# Patient Record
Sex: Male | Born: 1955 | Race: White | Hispanic: No | Marital: Married | Smoking: Never smoker
Health system: Southern US, Community
[De-identification: ages and names within clinical notes are randomized; demographics above are authoritative.]

## PROBLEM LIST (undated history)

## (undated) DIAGNOSIS — M1990 Unspecified osteoarthritis, unspecified site: Secondary | ICD-10-CM

## (undated) DIAGNOSIS — I10 Essential (primary) hypertension: Secondary | ICD-10-CM

## (undated) DIAGNOSIS — R7303 Prediabetes: Secondary | ICD-10-CM

## (undated) DIAGNOSIS — I447 Left bundle-branch block, unspecified: Secondary | ICD-10-CM

## (undated) DIAGNOSIS — E079 Disorder of thyroid, unspecified: Secondary | ICD-10-CM

## (undated) DIAGNOSIS — E039 Hypothyroidism, unspecified: Secondary | ICD-10-CM

## (undated) DIAGNOSIS — E559 Vitamin D deficiency, unspecified: Secondary | ICD-10-CM

## (undated) DIAGNOSIS — I8390 Asymptomatic varicose veins of unspecified lower extremity: Secondary | ICD-10-CM

## (undated) DIAGNOSIS — E785 Hyperlipidemia, unspecified: Secondary | ICD-10-CM

## (undated) HISTORY — DX: Asymptomatic varicose veins of unspecified lower extremity: I83.90

## (undated) HISTORY — DX: Left bundle-branch block, unspecified: I44.7

## (undated) HISTORY — DX: Essential (primary) hypertension: I10

## (undated) HISTORY — DX: Disorder of thyroid, unspecified: E07.9

## (undated) HISTORY — DX: Prediabetes: R73.03

## (undated) HISTORY — DX: Vitamin D deficiency, unspecified: E55.9

## (undated) HISTORY — PX: WISDOM TOOTH EXTRACTION: SHX21

## (undated) HISTORY — DX: Hyperlipidemia, unspecified: E78.5

---

## 1985-10-29 HISTORY — PX: OTHER SURGICAL HISTORY: SHX169

## 1998-05-29 ENCOUNTER — Emergency Department (HOSPITAL_COMMUNITY): Admission: EM | Admit: 1998-05-29 | Discharge: 1998-05-29 | Payer: Self-pay | Admitting: Emergency Medicine

## 1999-10-30 HISTORY — PX: HERNIA REPAIR: SHX51

## 2000-04-04 ENCOUNTER — Ambulatory Visit (HOSPITAL_BASED_OUTPATIENT_CLINIC_OR_DEPARTMENT_OTHER): Admission: RE | Admit: 2000-04-04 | Discharge: 2000-04-04 | Payer: Self-pay | Admitting: General Surgery

## 2004-09-16 LAB — HM COLONOSCOPY

## 2008-10-29 HISTORY — PX: CARDIAC CATHETERIZATION: SHX172

## 2009-03-02 ENCOUNTER — Encounter: Payer: Self-pay | Admitting: Cardiovascular Disease

## 2009-03-02 LAB — CONVERTED CEMR LAB
Cholesterol: 206 mg/dL
HDL: 49 mg/dL
LDL (calc): 115 mg/dL
LDL Cholesterol: 115 mg/dL
Triglyceride fasting, serum: 210 mg/dL

## 2009-03-03 ENCOUNTER — Encounter: Payer: Self-pay | Admitting: Cardiovascular Disease

## 2009-03-30 ENCOUNTER — Ambulatory Visit: Payer: Self-pay | Admitting: Cardiovascular Disease

## 2009-03-30 DIAGNOSIS — I447 Left bundle-branch block, unspecified: Secondary | ICD-10-CM

## 2009-03-30 DIAGNOSIS — E782 Mixed hyperlipidemia: Secondary | ICD-10-CM

## 2009-03-30 DIAGNOSIS — Z91013 Allergy to seafood: Secondary | ICD-10-CM

## 2009-04-08 ENCOUNTER — Telehealth (INDEPENDENT_AMBULATORY_CARE_PROVIDER_SITE_OTHER): Payer: Self-pay | Admitting: *Deleted

## 2009-04-25 ENCOUNTER — Ambulatory Visit: Payer: Self-pay

## 2009-04-25 ENCOUNTER — Encounter: Payer: Self-pay | Admitting: Cardiovascular Disease

## 2009-05-05 ENCOUNTER — Ambulatory Visit: Payer: Self-pay | Admitting: Cardiovascular Disease

## 2009-05-05 ENCOUNTER — Encounter: Payer: Self-pay | Admitting: Cardiovascular Disease

## 2009-05-05 ENCOUNTER — Encounter (INDEPENDENT_AMBULATORY_CARE_PROVIDER_SITE_OTHER): Payer: Self-pay | Admitting: *Deleted

## 2009-05-05 LAB — CONVERTED CEMR LAB
Basophils Absolute: 0 10*3/uL (ref 0.0–0.1)
CO2: 30 meq/L (ref 19–32)
Calcium: 9.1 mg/dL (ref 8.4–10.5)
Creatinine, Ser: 1.1 mg/dL (ref 0.4–1.5)
Eosinophils Absolute: 0.1 10*3/uL (ref 0.0–0.7)
Glucose, Bld: 79 mg/dL (ref 70–99)
Lymphocytes Relative: 33.4 % (ref 12.0–46.0)
MCHC: 35.2 g/dL (ref 30.0–36.0)
MCV: 86.5 fL (ref 78.0–100.0)
Monocytes Absolute: 0.4 10*3/uL (ref 0.1–1.0)
Neutro Abs: 2.6 10*3/uL (ref 1.4–7.7)
Neutrophils Relative %: 54.6 % (ref 43.0–77.0)
RDW: 12.1 % (ref 11.5–14.6)

## 2009-05-11 ENCOUNTER — Inpatient Hospital Stay (HOSPITAL_BASED_OUTPATIENT_CLINIC_OR_DEPARTMENT_OTHER): Admission: RE | Admit: 2009-05-11 | Discharge: 2009-05-11 | Payer: Self-pay | Admitting: Cardiovascular Disease

## 2009-05-11 ENCOUNTER — Ambulatory Visit: Payer: Self-pay | Admitting: Cardiovascular Disease

## 2009-05-20 ENCOUNTER — Telehealth (INDEPENDENT_AMBULATORY_CARE_PROVIDER_SITE_OTHER): Payer: Self-pay | Admitting: *Deleted

## 2009-06-08 ENCOUNTER — Telehealth: Payer: Self-pay | Admitting: Cardiovascular Disease

## 2009-06-23 ENCOUNTER — Ambulatory Visit: Payer: Self-pay | Admitting: Cardiovascular Disease

## 2010-04-17 ENCOUNTER — Encounter (INDEPENDENT_AMBULATORY_CARE_PROVIDER_SITE_OTHER): Payer: Self-pay | Admitting: *Deleted

## 2010-11-28 NOTE — Letter (Signed)
Summary: Appointment - Reminder 2  [employer], Main Office  [address]   [place], [place] [address]   Phone: [phone]  Fax: [phone]     April 17, 2010 MRN: [id number]   [facility] [address] [place], [place]  [address]   Dear Mr. Pyle,  Our records indicate that it is time to schedule a follow-up appointment with Dr. Eden Emms in August. It is very important that we reach you to schedule this appointment. We look forward to participating in your health care needs. Please contact us at the number listed above at your earliest convenience to schedule your appointment.  If you are unable to make an appointment at this time, give [place] a call so we can update our records.     Sincerely,   [facility] Scheduling Team

## 2011-02-04 LAB — POCT I-STAT 3, ART BLOOD GAS (G3+)
TCO2: 25 mmol/L (ref 0–100)
pH, Arterial: 7.457 — ABNORMAL HIGH (ref 7.350–7.450)

## 2011-02-04 LAB — POCT I-STAT 3, VENOUS BLOOD GAS (G3P V)
TCO2: 24 mmol/L (ref 0–100)
pCO2, Ven: 38.9 mmHg — ABNORMAL LOW (ref 45.0–50.0)
pH, Ven: 7.381 — ABNORMAL HIGH (ref 7.250–7.300)

## 2011-02-28 IMAGING — CR DG CHEST 2V SAME DAY
2 series · 2 of 2 positions shown · non-contrast
Comparison: None

CLINICAL DATA: History given of shortness of breath.

CHEST - 2 VIEW SAME DAY

[view not recorded (1 of 2)]
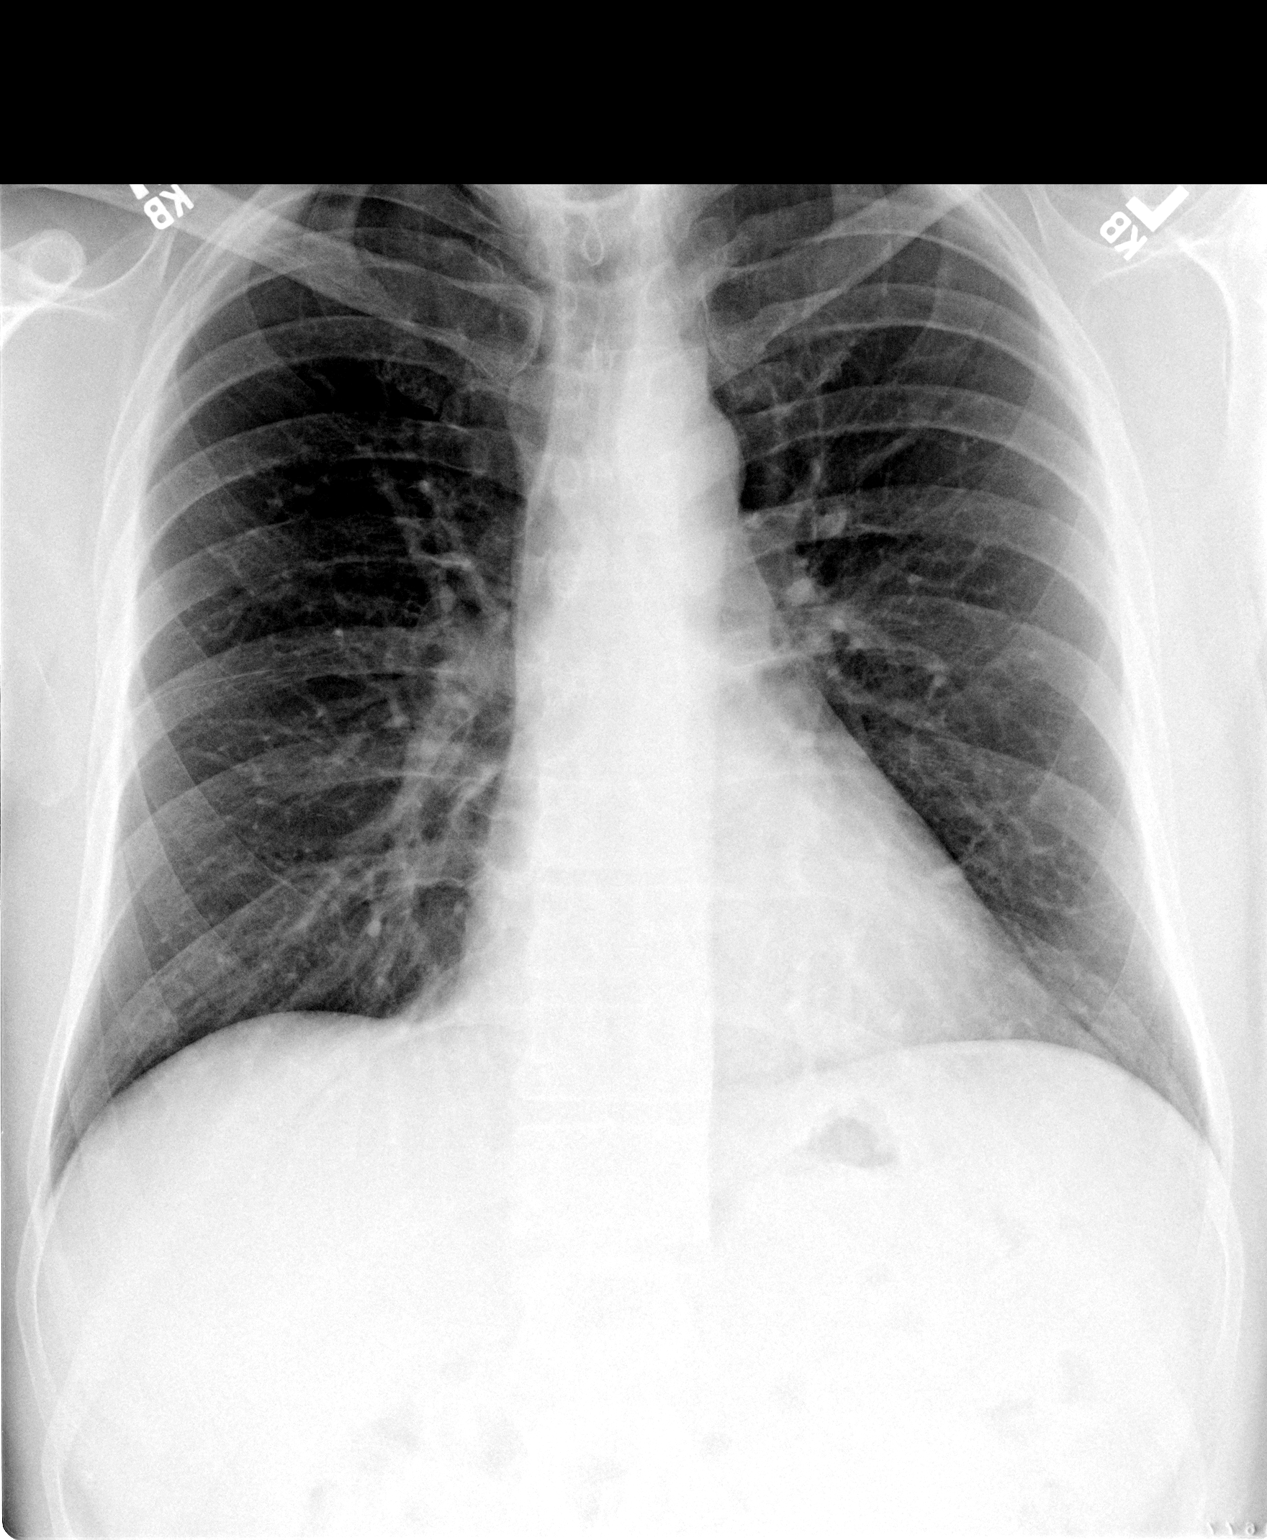

[view not recorded (2 of 2)]
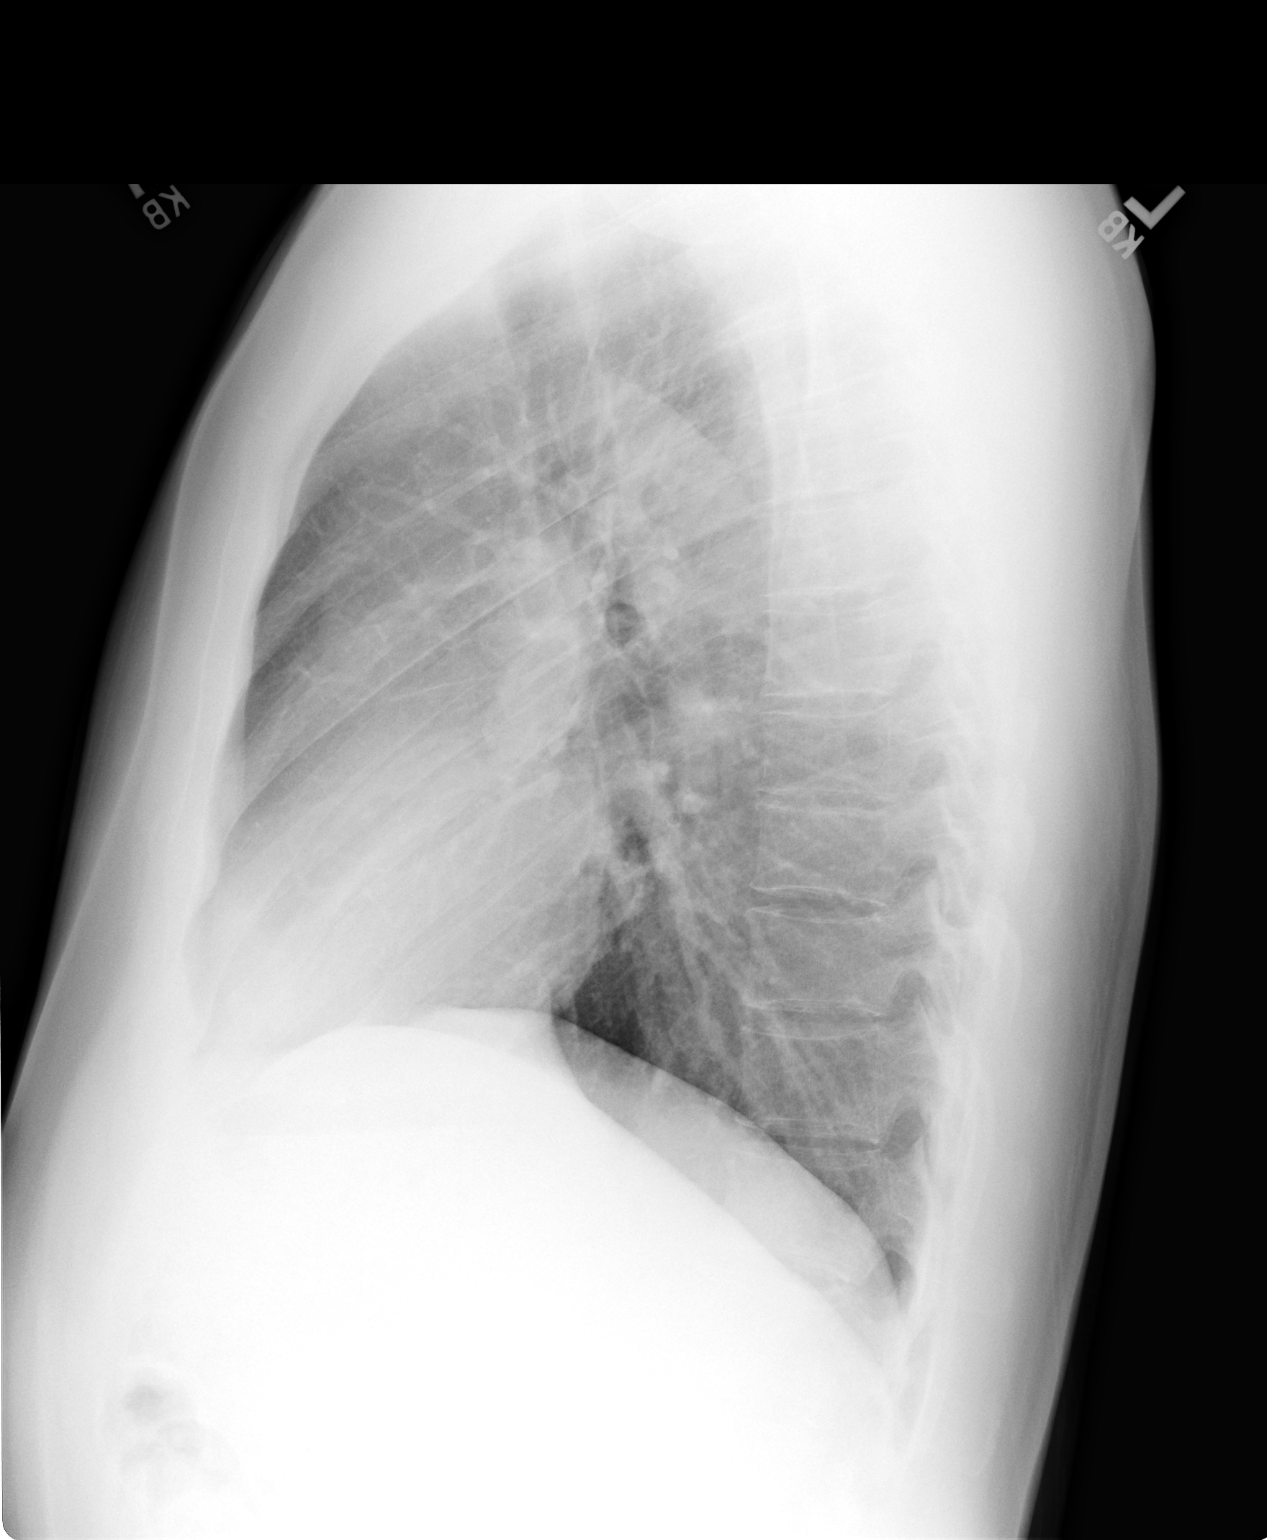

[2 of 2 positions shown; findings below may reference images not displayed]

FINDINGS: The cardiac silhouette is normal size and shape. The
lungs are well aerated and free of infiltrates. No pleural
abnormality is evident. Bones appear average for age.
IMPRESSION: No acute or active process is identified.

## 2011-03-13 NOTE — Cardiovascular Report (Signed)
[name], [name]                 ACCOUNT NO.:  [id number]   MEDICAL RECORD NO.:  [id number]          PATIENT TYPE:  OIB   LOCATION:  1961                         FACILITY:  [facility]   PHYSICIAN:  Peter C. Eden Emms, MD, FACCDATE OF BIRTH:  Nov 29, 1955   DATE OF PROCEDURE:  DATE OF DISCHARGE:                            CARDIAC CATHETERIZATION   INDICATION:  Cardiomyopathy with abnormal stress echo.  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   Left main coronary artery was normal.   Left anterior descending artery was normal.   First diagonal branch was normal.   The distal LAD was somewhat small.   The circumflex coronary artery was nondominant but large.  There was a  very large first obtuse marginal branch which was normal, mid and distal  circ were normal.   The right coronary artery was dominant.  There was a small  posterolateral branch and a large PDA.  It was normal.   Right heart catheterization showed mean right atrial pressure of 2, RV  pressure 33/10, PA pressure 17/11, mean pulmonary capillary wedge  pressure 7, LV pressure was 138/70, aortic pressure was 138/77.   RAO Ventriculography:  RAO ventriculography showed mild left ventricular  cavity enlargement with mild global hypokinesis.  The ejection fraction  was in the 50% range.   IMPRESSION:  The patient appeared to have a mild nonischemic  cardiomyopathy.  It may be related to his alcohol intake.  He is on good  medical therapy.  His filling pressures are normal.  He has no  significant coronary artery disease.  I think his overall prognosis is  good, particularly if he can stop his alcohol intake.   He will be cleared to do all activities regarding his duties with the  [employer] and is a Veterinary surgeon.   He will follow up in the office with me in 6 weeks.      [name].  Eden Emms, MD, [facility]  Electronically Signed     PCN/MEDQ  D:  05/11/2009  T:  05/11/2009  Job:  [id number]

## 2011-03-13 NOTE — Letter (Signed)
May 11, 2009    [facility]  Fax# [phone]   RE:  BREVIN, MCFADDEN  MRN:  [id number]  /  DOB:  10-17-1956   To Whom It May Concern:   Gerald [name] is a patient of [facility].  He has a history of  a left bundle-branch block.  He has mild decrease in left ventricular  function.  His ejection fraction is in the 50% range.  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   This was performed on May 11, 2009.  Fortunately, [name] has no  significant coronary artery disease.  His heart pressures were normal.  He does have mildly decreased heart function likely due to a  cardiomyopathic process, which is mild and stable.   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  He  has no limitations and his overall prognosis is excellent.   If you have any questions, do not hesitate to contact me.    Sincerely,      [name]. Eden Emms, MD, [facility]  Electronically Signed    PCN/MedQ  DD: 05/11/2009  DT: 05/12/2009  Job #: [id number]   CC:    Beatris Ship

## 2011-03-16 NOTE — Op Note (Signed)
[facility]  Patient:    Gerald [name], Gerald [name]                        MRN: [id number] Proc. Date: 04/04/00 Adm. Date:  [id number] Disc. Date: [id number] Attending:  Henrene Dodge CC:         Anselm Pancoast. Zachery Dakins, M.D.             Marinda Elk, M.D.                           Operative Report  PREOPERATIVE DIAGNOSIS:  Right inguinal hernia.  POSTOPERATIVE DIAGNOSIS:  Right inguinal hernia, indirect.  OPERATION:  Right inguinal herniorrhaphy with mesh reinforcement.  SURGEON:  Anselm Pancoast. Zachery Dakins, M.D.  ASSISTANT:  Nurse.  ANESTHESIA:  Local with sedation.  INDICATIONS:  Gerald [name] is a 55 year old Caucasian male who has had a recent onset of a bulge from the right groin and saw me recently.  He stated that he was moving a package of wet newspapers recently when he got a definite tenderness and bulge appeared shortly afterwards.  He is scheduled for an [employer] camp in approximately 10 days and desires to proceed with herniorrhaphy at this time prior to camp.  On examination, he has an obvious bulge with straining that would reduce spontaneously with relaxant.  DESCRIPTION OF PROCEDURE:  Preoperatively he was given a gram of Kefzol and IV had been started, position on the OR table.  The groin area was clipped and then prepped with Betadine solution.  He was draped in a sterile manner and the inguinal incision area was infiltrated with a mixture of Marcaine 0.25% with 0.5 Xylocaine with adrenaline in the inguinal nerve area, blocked by blunted 21 gauge needle and all total about 25 cc of solution used.  The incision was made down through the Scarpas fascia.  The superficial veins x 2 was clamped, divided and ligated with fine Vicryl and then the external oblique aponeurosis identified and opened through the external ring.  The cord structures were elevated and this was noted to be an indirect component coming down with the cord  structures and the hernia sac was separated from the cord structures and high sac ligation done under direct vision.  The vas and vessels were protected nicely.  There was also a little lipoma of the cord that was separated from the cord structures and this was clamped with a hemostat and then ligated with fine Vicryl.  The patient was also developing a direct inguinal hernia that was really not symptomatic but I reinforced the floor of the Shouldice type repair with 2-0 Prolene recreating an internal ring and tying the two ends back together.  A piece of Prolene mesh was then placed shaped like a sail, slit laterally, staring the symphysis pubis.  The inferior limbs sutured to the running 2-0 Prolene.  The superior limbs interrupted sutures to the conjoin tendon and rectus fascia area.  The two tails were then sutured together lateral and recreating the internal ring.  It was snug but not tight.  The ilioinguinal nerve had been protected and the mesh was actually under this as it goes not with the cord structures but slightly superior.  The external oblique was then closed with 3-0 Vicryl.  The Scarpas fascia interrupted 3-0 Vicryl, 4-0 Dexon subcuticular, benzoin and Steri-Strips on the skin.  The testicle was in its normal position  and he was awake and hungry at the completion of surgery. DD:  04/04/00 TD:  04/09/00 Job: [id number]

## 2012-08-08 ENCOUNTER — Emergency Department (HOSPITAL_BASED_OUTPATIENT_CLINIC_OR_DEPARTMENT_OTHER)
Admission: EM | Admit: 2012-08-08 | Discharge: 2012-08-08 | Disposition: A | Discharge status: Home / Self Care | Payer: 59 | Attending: Emergency Medicine | Admitting: Emergency Medicine

## 2012-08-08 ENCOUNTER — Encounter (HOSPITAL_BASED_OUTPATIENT_CLINIC_OR_DEPARTMENT_OTHER): Payer: Self-pay | Admitting: Emergency Medicine

## 2012-08-08 DIAGNOSIS — X58XXXA Exposure to other specified factors, initial encounter: Secondary | ICD-10-CM | POA: Insufficient documentation

## 2012-08-08 DIAGNOSIS — T7840XA Allergy, unspecified, initial encounter: Secondary | ICD-10-CM | POA: Insufficient documentation

## 2012-08-08 MED ORDER — PREDNISONE 50 MG PO TABS
60.0000 mg | ORAL_TABLET | Freq: Once | ORAL | Status: AC
  Administered 2012-08-08: 60 mg via ORAL
  Filled 2012-08-08: qty 1

## 2012-08-08 MED ORDER — PREDNISONE 50 MG PO TABS: ORAL_TABLET | ORAL | Status: DC

## 2012-08-08 MED ORDER — DIPHENHYDRAMINE HCL 25 MG PO CAPS
25.0000 mg | ORAL_CAPSULE | Freq: Once | ORAL | Status: AC
  Administered 2012-08-08: 25 mg via ORAL
  Filled 2012-08-08: qty 1

## 2012-08-08 MED ORDER — FAMOTIDINE 20 MG PO TABS: 20.0000 mg | ORAL_TABLET | Freq: Two times a day (BID) | ORAL | Status: DC

## 2012-08-08 MED ORDER — FAMOTIDINE 20 MG PO TABS
20.0000 mg | ORAL_TABLET | Freq: Once | ORAL | Status: AC
  Administered 2012-08-08: 20 mg via ORAL
  Filled 2012-08-08: qty 1

## 2012-08-08 NOTE — ED Provider Notes (Signed)
History     CSN: [id number]  Arrival date & time 08/08/12  0128   First MD Initiated Contact with Patient 08/08/12 0142      Chief Complaint  Patient presents with  . Allergic Reaction    (Consider location/radiation/quality/duration/timing/severity/associated sxs/prior treatment) Patient is a 56 y.o. male presenting with allergic reaction. The history is provided by the patient.  Allergic Reaction The primary symptoms are  rash. The primary symptoms do not include wheezing, shortness of breath, cough, abdominal pain, nausea, vomiting, diarrhea, dizziness, palpitations, altered mental status, angioedema or urticaria. The current episode started more than 2 days ago. The problem has been gradually worsening. This is a new problem.  The rash began 2 to 7 days ago. The rash appears on the chest, right arm and left arm. The pain associated with the rash is moderate. The rash is associated with itching. The rash is not associated with blisters or weeping. Risk factors: unknown.  Associated with: unknown. Significant symptoms also include itching. Significant symptoms that are not present include eye redness or rhinorrhea.    No past medical history on file.  No past surgical history on file.  No family history on file.  History  Substance Use Topics  . Smoking status: Not on file  . Smokeless tobacco: Not on file  . Alcohol Use: Not on file      Review of Systems  HENT: Negative for rhinorrhea.   Eyes: Negative for redness.  Respiratory: Negative for cough, shortness of breath and wheezing.   Cardiovascular: Negative for palpitations.  Gastrointestinal: Negative for nausea, vomiting, abdominal pain and diarrhea.  Skin: Positive for itching and rash.  Neurological: Negative for dizziness.  Psychiatric/Behavioral: Negative for altered mental status.  All other systems reviewed and are negative.    Allergies  Iodine  Home Medications   Current Outpatient Rx  Name  Route Sig Dispense Refill  . ENALAPRIL MALEATE 20 MG PO TABS Oral Take 20 mg by mouth daily.    Marland Kitchen LEVOTHYROXINE SODIUM 50 MCG PO TABS Oral Take 50 mcg by mouth daily.    Marland Kitchen ROSUVASTATIN CALCIUM 20 MG PO TABS Oral Take 20 mg by mouth daily.      BP 157/98  Pulse 69  Temp 97.9 F (36.6 C) (Oral)  Resp 17  Ht 6' (1.829 m)  Wt 200 lb (90.719 kg)  BMI 27.12 kg/m2  SpO2 99%  Physical Exam  Constitutional: He is oriented to person, place, and time. He appears well-developed and well-nourished.  HENT:  Head: Normocephalic and atraumatic.  Mouth/Throat: Oropharynx is clear and moist.       No swelling of the lips tongue or uvula  Eyes: Conjunctivae normal are normal. Pupils are equal, round, and reactive to light.  Neck: Normal range of motion. Neck supple.  Cardiovascular: Normal rate and regular rhythm.   Pulmonary/Chest: Effort normal and breath sounds normal. No stridor. He has no wheezes.  Abdominal: Soft. Bowel sounds are normal. There is no tenderness.  Musculoskeletal: Normal range of motion.  Neurological: He is alert and oriented to person, place, and time.  Skin: Skin is warm and dry. Rash noted.       Maculopapular eruption of the trunk     ED Course  Procedures (including critical care time)  Labs Reviewed - No data to display No results found.   No diagnosis found.    MDM  Feeling improved discuss with doctor.  Thinks soap may be a new formulation.  D/c soap  Jasmine Awe, MD 08/08/12 [id number]

## 2012-08-08 NOTE — ED Notes (Signed)
C/o itching to axillary regions x 2 days, also has red rash to chest and back. No resp diff noted.

## 2013-03-18 ENCOUNTER — Encounter: Payer: Self-pay | Admitting: Vascular Surgery

## 2013-03-18 ENCOUNTER — Other Ambulatory Visit: Payer: Self-pay | Admitting: *Deleted

## 2013-03-18 DIAGNOSIS — I83893 Varicose veins of bilateral lower extremities with other complications: Secondary | ICD-10-CM

## 2013-05-22 ENCOUNTER — Encounter: Payer: Self-pay | Admitting: Vascular Surgery

## 2013-05-25 ENCOUNTER — Other Ambulatory Visit: Payer: Self-pay | Admitting: *Deleted

## 2013-05-25 ENCOUNTER — Encounter (INDEPENDENT_AMBULATORY_CARE_PROVIDER_SITE_OTHER): Payer: 59 | Admitting: *Deleted

## 2013-05-25 ENCOUNTER — Encounter: Payer: Self-pay | Admitting: Vascular Surgery

## 2013-05-25 ENCOUNTER — Ambulatory Visit (INDEPENDENT_AMBULATORY_CARE_PROVIDER_SITE_OTHER): Payer: 59 | Admitting: Vascular Surgery

## 2013-05-25 VITALS — BP 152/86 | HR 59 | Resp 18 | Ht 73.0 in | Wt 211.0 lb

## 2013-05-25 DIAGNOSIS — I83893 Varicose veins of bilateral lower extremities with other complications: Secondary | ICD-10-CM

## 2013-05-25 DIAGNOSIS — M79609 Pain in unspecified limb: Secondary | ICD-10-CM

## 2013-05-25 DIAGNOSIS — Z01810 Encounter for preprocedural cardiovascular examination: Secondary | ICD-10-CM

## 2013-05-25 NOTE — Progress Notes (Signed)
Subjective:     Patient ID: Gerald [name], male   DOB: [birth year], 57 y.o.   MRN: [id number] HPI this 57 year old healthy male patient is referred by Dr. Marlowe Shores for evaluation of right leg pain and rule out venous insufficiency. Patient has no history of DVT, thrombophlebitis, varicose veins, bleeding, or skin breakdown. He denies claudication symptoms being able to ambulate at least 1 mile or farther. He does have some aching discomfort in his right thigh and calf the lateral aspect which has been present intermittently for the past year. Denies any trauma. This started after a motorcycle trip to [place] one year ago. He has no symptoms the contralateral left leg. He denies any edema in the ankle and feet bilaterally.  Past Medical History  Diagnosis Date  . Hypertension   . Hyperlipidemia   . Thyroid disease     HYPO  . Varicose veins     History  Substance Use Topics  . Smoking status: Never Smoker   . Smokeless tobacco: Former Neurosurgeon    Types: Chew    Quit date: 05/25/1998  . Alcohol Use: Yes    Family History  Problem Relation Age of Onset  . Cancer Mother     breast  . Cancer Father     lung    Allergies  Allergen Reactions  . Iodine Rash    Current outpatient prescriptions:enalapril (VASOTEC) 20 MG tablet, Take 20 mg by mouth daily., Disp: , Rfl: ;  levothyroxine (SYNTHROID, LEVOTHROID) 50 MCG tablet, Take 50 mcg by mouth daily., Disp: , Rfl: ;  Magnesium 250 MG TABS, Take 250 mg by mouth daily., Disp: , Rfl: ;  Omega 3-6-9 Fatty Acids (TRIPLE OMEGA COMPLEX PO), Take by mouth daily., Disp: , Rfl: ;  rosuvastatin (CRESTOR) 20 MG tablet, Take 20 mg by mouth daily., Disp: , Rfl:  famotidine (PEPCID) 20 MG tablet, Take 1 tablet (20 mg total) by mouth 2 (two) times daily., Disp: 10 tablet, Rfl: 0;  predniSONE (DELTASONE) 50 MG tablet, 1 tab po qam, Disp: 5 tablet, Rfl: 0  BP 152/86  Pulse 59  Resp 18  Ht 6\' 1"  (1.854 m)  Wt 211 lb (95.709 kg)  BMI 27.84  kg/m2  Body mass index is 27.84 kg/(m^2).           Review of Systems denies chest pain, dyspnea on exertion, PND, orthopnea, hemoptysis. All systems negative and complete review of systems     Objective:   Physical Exam blood pressure 150/86 heart rate 59 respirations 18 Gen.-alert and oriented x3 in no apparent distress HEENT normal for age Lungs no rhonchi or wheezing Cardiovascular regular rhythm no murmurs carotid pulses 3+ palpable no bruits audible Abdomen soft nontender no palpable masses Musculoskeletal free of  major deformities Skin clear -no rashes Neurologic normal Lower extremities 3+ femoral and dorsalis pedis pulses palpable bilaterally with no edema No evidence of varicose veins, spider veins, or any venous abnormalities.  I ordered venous duplex exam of the right leg which are reviewed and interpreted. There is no DVT. There is no reflux in the great or small saphenous veins.      Assessment:     Right leg pain-etiology unknown-no evidence of arterial or venous insufficiency    Plan:     Return to see me on when necessary basis-no need for any further vascular workup

## 2013-09-09 ENCOUNTER — Other Ambulatory Visit: Payer: Self-pay | Admitting: *Deleted

## 2013-09-09 MED ORDER — ROSUVASTATIN CALCIUM 20 MG PO TABS: 20.0000 mg | ORAL_TABLET | Freq: Every day | ORAL | Status: DC

## 2013-09-16 ENCOUNTER — Encounter: Payer: Self-pay | Admitting: Physician Assistant

## 2013-09-16 DIAGNOSIS — E559 Vitamin D deficiency, unspecified: Secondary | ICD-10-CM | POA: Insufficient documentation

## 2013-09-16 DIAGNOSIS — E785 Hyperlipidemia, unspecified: Secondary | ICD-10-CM

## 2013-09-16 DIAGNOSIS — E039 Hypothyroidism, unspecified: Secondary | ICD-10-CM | POA: Insufficient documentation

## 2013-09-16 DIAGNOSIS — R7303 Prediabetes: Secondary | ICD-10-CM

## 2013-09-16 DIAGNOSIS — I10 Essential (primary) hypertension: Secondary | ICD-10-CM | POA: Insufficient documentation

## 2013-09-16 DIAGNOSIS — E079 Disorder of thyroid, unspecified: Secondary | ICD-10-CM

## 2013-09-17 ENCOUNTER — Encounter: Payer: Self-pay | Admitting: Physician Assistant

## 2013-09-17 ENCOUNTER — Ambulatory Visit: Payer: 59 | Admitting: Physician Assistant

## 2013-09-17 VITALS — BP 130/82 | HR 72 | Temp 98.6°F | Resp 16 | Ht 74.0 in | Wt 209.0 lb

## 2013-09-17 DIAGNOSIS — R7303 Prediabetes: Secondary | ICD-10-CM

## 2013-09-17 DIAGNOSIS — I10 Essential (primary) hypertension: Secondary | ICD-10-CM

## 2013-09-17 DIAGNOSIS — E782 Mixed hyperlipidemia: Secondary | ICD-10-CM

## 2013-09-17 DIAGNOSIS — E559 Vitamin D deficiency, unspecified: Secondary | ICD-10-CM

## 2013-09-17 LAB — HEMOGLOBIN A1C: Mean Plasma Glucose: 108 mg/dL (ref <117)

## 2013-09-17 LAB — BASIC METABOLIC PANEL WITH GFR
GFR, Est African American: >89 mL/min
Glucose, Bld: 89 mg/dL (ref 70–99)
Potassium: 4.7 mEq/L (ref 3.5–5.3)
Sodium: 135 mEq/L (ref 135–145)

## 2013-09-17 LAB — HEPATIC FUNCTION PANEL
ALT: 33 U/L (ref 0–53)
Albumin: 4.9 g/dL (ref 3.5–5.2)
Indirect Bilirubin: 0.5 mg/dL (ref 0.0–0.9)
Total Protein: 7.1 g/dL (ref 6.0–8.3)

## 2013-09-17 LAB — CBC WITH DIFFERENTIAL/PLATELET
Basophils Relative: 0 % (ref 0–1)
Hemoglobin: 16.3 g/dL (ref 13.0–17.0)
Lymphs Abs: 2.7 10*3/uL (ref 0.7–4.0)
Monocytes Relative: 9 % (ref 3–12)
Neutro Abs: 4.2 10*3/uL (ref 1.7–7.7)
Neutrophils Relative %: 55 % (ref 43–77)
RBC: 5.41 MIL/uL (ref 4.22–5.81)

## 2013-09-17 LAB — LIPID PANEL
LDL Cholesterol: 167 mg/dL — ABNORMAL HIGH (ref 0–99)
Triglycerides: 139 mg/dL (ref <150)

## 2013-09-17 NOTE — Progress Notes (Signed)
HPI Patient presents for 3 month follow up with hypertension, hyperlipidemia, prediabetes and vitamin D. Patient's blood pressure has been controlled at home. Patient denies chest pain, shortness of breath, dizziness.  Patient's cholesterol is diet controlled. In addition they are on crestor 20  and denies myalgias. The cholesterol last visit was 100. The patient has been working on diet and exercise for prediabetes, and denies changes in vision, polys, and paresthesias.  A1C 5.5.  Patient is on Vitamin D supplement.  Current Medications:  Current Outpatient Prescriptions on File Prior to Visit  Medication Sig Dispense Refill  . cholecalciferol (VITAMIN D) 1000 UNITS tablet Take 6,000 Units by mouth daily.      . enalapril (VASOTEC) 20 MG tablet Take 20 mg by mouth daily.      . famotidine (PEPCID) 20 MG tablet Take 1 tablet (20 mg total) by mouth 2 (two) times daily.  10 tablet  0  . levothyroxine (SYNTHROID, LEVOTHROID) 50 MCG tablet Take 50 mcg by mouth daily.      . Magnesium 250 MG TABS Take 250 mg by mouth daily.      . Multiple Vitamins-Minerals (MULTIVITAMIN PO) Take by mouth.      Ailene Ards 3-6-9 Fatty Acids (TRIPLE OMEGA COMPLEX PO) Take by mouth daily.      . predniSONE (DELTASONE) 50 MG tablet 1 tab po qam  5 tablet  0  . rosuvastatin (CRESTOR) 20 MG tablet Take 1 tablet (20 mg total) by mouth daily.  30 tablet  3   No current facility-administered medications on file prior to visit.   Medical History:  Past Medical History  Diagnosis Date  . Hypertension   . Hyperlipidemia   . Thyroid disease     HYPO  . Varicose veins   . Vitamin D deficiency   . Prediabetes   . LBBB (left bundle branch block)    Allergies:  Allergies  Allergen Reactions  . Simvastatin   . Iodine Rash    ROS Constitutional: Denies fever, chills, weight loss/gain, headaches, insomnia, fatigue, night sweats, and change in appetite. Eyes: Denies redness, blurred vision, diplopia, discharge, itchy,  watery eyes.  ENT: Denies discharge, congestion, post nasal drip, sore throat, earache, dental pain, Tinnitus, Vertigo, Sinus pain, snoring.  Cardio: Denies chest pain, palpitations, irregular heartbeat,  dyspnea, diaphoresis, orthopnea, PND, claudication, edema Respiratory: denies cough, dyspnea,pleurisy, hoarseness, wheezing.  Gastrointestinal: Denies dysphagia, heartburn,  water brash, pain, cramps, nausea, vomiting, bloating, diarrhea, constipation, hematemesis, melena, hematochezia,  hemorrhoids Genitourinary: Denies dysuria, frequency, urgency, nocturia, hesitancy, discharge, hematuria, flank pain Musculoskeletal: Denies arthralgia, myalgia, stiffness, Jt. Swelling, pain, limp, and strain/sprain. Skin: Denies pruritis, rash, hives, warts, acne, eczema, changing in skin lesion Neuro: Weakness, tremor, incoordination, spasms, paresthesia, pain Psychiatric: Denies confusion, memory loss, sensory loss Endocrine: Denies change in weight, skin, hair change, nocturia, and paresthesia, Diabetic Polys, visual blurring, hyper /hypo glycemic episodes.  Heme/Lymph: Excessive bleeding, bruising, enlarged lymph nodes  Family history- Review and unchanged Social history- Review and unchanged Physical Exam: Filed Vitals:   09/17/13 1613  BP: 130/82  Pulse: 72  Temp: 98.6 F (37 C)  Resp: 16   Filed Weights   09/17/13 1613  Weight: 209 lb (94.802 kg)   General Appearance: Well nourished, in no apparent distress. Eyes: PERRLA, EOMs, conjunctiva no swelling or erythema, normal fundi and vessels. Sinuses: No Frontal/maxillary tenderness ENT/Mouth: Ext aud canals clear, with TMs without erythema, bulging.No erythema, swelling, or exudate on post pharynx.  Tonsils not swollen or erythematous.  Hearing normal.  Neck: Supple, thyroid normal.  Respiratory: Respiratory effort normal, BS equal bilaterally without rales, rhonchi, wheezing or stridor.  Cardio: Heart sounds normal, regular rate and rhythm  without murmurs, rubs or gallops. Peripheral pulses brisk and equal bilaterally, without edema.  Abdomen: Flat, soft, with bowel sounds. Non tender, no guarding, rebound, hernias, masses, or organomegaly.  Lymphatics: Non tender without lymphadenopathy.  Musculoskeletal: Full ROM all peripheral extremities, joint stability, 5/5 strength, and normal gait. Skin: Warm, dry without rashes, lesions, ecchymosis.  Neuro: Cranial nerves intact, reflexes equal bilaterally. Normal muscle tone, no cerebellar symptoms. Sensation intact.  Psych: Awake and oriented X 3, normal affect, Insight and Judgment appropriate.   Assessment and Plan:  Hypertension: Continue medication, monitor blood pressure at home. Continue DASH diet. Cholesterol: Continue diet and exercise. Check cholesterol.  Pre-diabetes-Continue diet and exercise. Check A1C Vitamin D Def- check level and continue medications.   Gerald [name] 4:27 PM

## 2013-09-17 NOTE — Patient Instructions (Signed)
DASH Diet  The DASH diet stands for "Dietary Approaches to Stop Hypertension." It is a healthy eating plan that has been shown to reduce high blood pressure (hypertension) in as little as 14 days, while also possibly providing other significant health benefits. These other health benefits include reducing the risk of breast cancer after menopause and reducing the risk of type 2 diabetes, heart disease, colon cancer, and stroke. Health benefits also include weight loss and slowing kidney failure in patients with chronic kidney disease.   DIET GUIDELINES  · Limit salt (sodium). Your diet should contain less than 1500 mg of sodium daily.  · Limit refined or processed carbohydrates. Your diet should include mostly whole grains. Desserts and added sugars should be used sparingly.  · Include small amounts of heart-healthy fats. These types of fats include nuts, oils, and tub margarine. Limit saturated and trans fats. These fats have been shown to be harmful in the body.  CHOOSING FOODS   The following food groups are based on a 2000 calorie diet. See your Registered Dietitian for individual calorie needs.  Grains and Grain Products (6 to 8 servings daily)  · Eat More Often: Whole-wheat bread, brown rice, whole-grain or wheat pasta, quinoa, popcorn without added fat or salt (air popped).  · Eat Less Often: White bread, white pasta, white rice, cornbread.  Vegetables (4 to 5 servings daily)  · Eat More Often: Fresh, frozen, and canned vegetables. Vegetables may be raw, steamed, roasted, or grilled with a minimal amount of fat.  · Eat Less Often/Avoid: Creamed or fried vegetables. Vegetables in a cheese sauce.  Fruit (4 to 5 servings daily)  · Eat More Often: All fresh, canned (in natural juice), or frozen fruits. Dried fruits without added sugar. One hundred percent fruit juice (½ cup [237 mL] daily).  · Eat Less Often: Dried fruits with added sugar. Canned fruit in light or heavy syrup.  Lean Meats, Fish, and Poultry (2  servings or less daily. One serving is 3 to 4 oz [85-114 g]).  · Eat More Often: Ninety percent or leaner ground beef, tenderloin, sirloin. Round cuts of beef, chicken breast, turkey breast. All fish. Grill, bake, or broil your meat. Nothing should be fried.  · Eat Less Often/Avoid: Fatty cuts of meat, turkey, or chicken leg, thigh, or wing. Fried cuts of meat or fish.  Dairy (2 to 3 servings)  · Eat More Often: Low-fat or fat-free milk, low-fat plain or light yogurt, reduced-fat or part-skim cheese.  · Eat Less Often/Avoid: Milk (whole, 2%). Whole milk yogurt. Full-fat cheeses.  Nuts, Seeds, and Legumes (4 to 5 servings per week)  · Eat More Often: All without added salt.  · Eat Less Often/Avoid: Salted nuts and seeds, canned beans with added salt.  Fats and Sweets (limited)  · Eat More Often: Vegetable oils, tub margarines without trans fats, sugar-free gelatin. Mayonnaise and salad dressings.  · Eat Less Often/Avoid: Coconut oils, palm oils, butter, stick margarine, cream, half and half, cookies, candy, pie.  FOR MORE INFORMATION  The Dash Diet Eating Plan: www.dashdiet.org  Document Released: 10/04/2011 Document Revised: 01/07/2012 Document Reviewed: 10/04/2011  [employer]® Patient Information ©2014 [employer].

## 2013-09-18 LAB — INSULIN, FASTING: Insulin fasting, serum: 14 u[IU]/mL (ref 3–28)

## 2013-09-18 LAB — VITAMIN D 25 HYDROXY (VIT D DEFICIENCY, FRACTURES): Vit D, 25-Hydroxy: 92 ng/mL — ABNORMAL HIGH (ref 30–89)

## 2013-12-22 ENCOUNTER — Ambulatory Visit: Payer: Self-pay | Admitting: Internal Medicine

## 2014-02-15 ENCOUNTER — Other Ambulatory Visit: Payer: Self-pay | Admitting: Family Medicine

## 2014-02-16 LAB — COMPLETE METABOLIC PANEL WITH GFR
ALBUMIN: 4.7 g/dL (ref 3.5–5.2)
ALT: 32 U/L (ref 0–53)
AST: 21 U/L (ref 0–37)
Alkaline Phosphatase: 83 U/L (ref 39–117)
BILIRUBIN TOTAL: 0.9 mg/dL (ref 0.2–1.2)
BUN: 12 mg/dL (ref 6–23)
CO2: 28 mEq/L (ref 19–32)
Calcium: 9.5 mg/dL (ref 8.4–10.5)
Chloride: 99 mEq/L (ref 96–112)
Creat: 0.99 mg/dL (ref 0.50–1.35)
GFR, EST NON AFRICAN AMERICAN: 85 mL/min
GFR, Est African American: >89 mL/min
Glucose, Bld: 90 mg/dL (ref 70–99)
POTASSIUM: 4.5 meq/L (ref 3.5–5.3)
Sodium: 136 mEq/L (ref 135–145)
Total Protein: 7.1 g/dL (ref 6.0–8.3)

## 2014-02-16 LAB — LIPID PANEL
Cholesterol: 194 mg/dL (ref 0–200)
HDL: 60 mg/dL (ref >39)
LDL Cholesterol: 113 mg/dL — ABNORMAL HIGH (ref 0–99)
Total CHOL/HDL Ratio: 3.2 Ratio
Triglycerides: 104 mg/dL (ref <150)
VLDL: 21 mg/dL (ref 0–40)

## 2014-02-18 ENCOUNTER — Encounter: Payer: Self-pay | Admitting: Family Medicine

## 2014-03-18 ENCOUNTER — Ambulatory Visit (INDEPENDENT_AMBULATORY_CARE_PROVIDER_SITE_OTHER): Payer: 59 | Admitting: Physician Assistant

## 2014-03-18 ENCOUNTER — Encounter: Payer: Self-pay | Admitting: Physician Assistant

## 2014-03-18 VITALS — BP 132/80 | HR 64 | Temp 98.1°F | Resp 16 | Wt 205.0 lb

## 2014-03-18 DIAGNOSIS — W57XXXA Bitten or stung by nonvenomous insect and other nonvenomous arthropods, initial encounter: Secondary | ICD-10-CM

## 2014-03-18 DIAGNOSIS — T148 Other injury of unspecified body region: Secondary | ICD-10-CM

## 2014-03-18 MED ORDER — DOXYCYCLINE HYCLATE 100 MG PO TABS: 100.0000 mg | ORAL_TABLET | Freq: Two times a day (BID) | ORAL | Status: DC

## 2014-03-18 NOTE — Progress Notes (Signed)
   Subjective:    Patient ID: Gerald [name], male    DOB: 12-10-1955, 58 y.o.   MRN: [id number]  HPI 58 y.o. male with history of tick bite in [place], he states he suspects it attached on Sunday and found Monday night. It has been itching and he has been scratching at it. He has a large erythematous Darek on his left anterior distal shin. Denies fever, chills, flu symptoms, arthralgias, rash.   Review of Systems  Constitutional: Negative.   HENT: Negative.   Respiratory: Negative.   Cardiovascular: Negative.   Gastrointestinal: Negative.   Genitourinary: Negative.   Musculoskeletal: Negative.   Skin: Positive for rash and wound.  Neurological: Negative.        Objective:   Physical Exam  Constitutional: He appears well-developed and well-nourished.  HENT:  Head: Normocephalic and atraumatic.  Eyes: Conjunctivae are normal. Pupils are equal, round, and reactive to light.  Neck: Normal range of motion. Neck supple.  Cardiovascular: Normal rate and regular rhythm.   Pulmonary/Chest: Effort normal and breath sounds normal.  Abdominal: Soft. Bowel sounds are normal.  Skin: Skin is warm and dry. There is erythema.  Psychiatric:  right anterior leg with punctuate oozing serosanguinous fluid, and 1.5x2 in erythema area      Assessment & Plan:  Tick bite- ? With secondary infection- will treat with 2 weeks doxy Follow up if symptoms worsen

## 2014-03-18 NOTE — Patient Instructions (Signed)
Tick Bite Information Ticks are insects that attach themselves to the skin and draw blood for food. There are various types of ticks. Common types include wood ticks and deer ticks. Most ticks live in shrubs and grassy areas. Ticks can climb onto your body when you make contact with leaves or grass where the tick is waiting. The most common places on the body for ticks to attach themselves are the scalp, neck, armpits, waist, and groin. Most tick bites are harmless, but sometimes ticks carry germs that cause diseases. These germs can be spread to a person during the tick's feeding process. The chance of a disease spreading through a tick bite depends on:   The type of tick.  Time of year.   How long the tick is attached.   Geographic location.  HOW CAN YOU PREVENT TICK BITES? Take these steps to help prevent tick bites when you are outdoors:  Wear protective clothing. Long sleeves and long pants are best.   Wear white clothes so you can see ticks more easily.  Tuck your pant legs into your socks.   If walking on a trail, stay in the middle of the trail to avoid brushing against bushes.  Avoid walking through areas with long grass.  Put insect repellent on all exposed skin and along boot tops, pant legs, and sleeve cuffs.   Check clothing, hair, and skin repeatedly and before going inside.   Brush off any ticks that are not attached.  Take a shower or bath as soon as possible after being outdoors.  WHAT IS THE PROPER WAY TO REMOVE A TICK? Ticks should be removed as soon as possible to help prevent diseases caused by tick bites. 1. If latex gloves are available, put them on before trying to remove a tick.  2. Using fine-point tweezers, grasp the tick as close to the skin as possible. You may also use curved forceps or a tick removal tool. Grasp the tick as close to its head as possible. Avoid grasping the tick on its body. 3. Pull gently with steady upward pressure until  the tick lets go. Do not twist the tick or jerk it suddenly. This may break off the tick's head or mouth parts. 4. Do not squeeze or crush the tick's body. This could force disease-carrying fluids from the tick into your body.  5. After the tick is removed, wash the bite area and your hands with soap and water or other disinfectant such as alcohol. 6. Apply a small amount of antiseptic cream or ointment to the bite site.  7. Wash and disinfect any instruments that were used.  Do not try to remove a tick by applying a hot match, petroleum jelly, or fingernail polish to the tick. These methods do not work and may increase the chances of disease being spread from the tick bite.  WHEN SHOULD YOU SEEK MEDICAL CARE? Contact your health care provider if you are unable to remove a tick from your skin or if a part of the tick breaks off and is stuck in the skin.  After a tick bite, you need to be aware of signs and symptoms that could be related to diseases spread by ticks. Contact your health care provider if you develop any of the following in the days or weeks after the tick bite:  Unexplained fever.  Rash. A circular rash that appears days or weeks after the tick bite may indicate the possibility of Lyme disease. The rash may resemble   a target with a bull's-eye and may occur at a different part of your body than the tick bite.  Redness and swelling in the area of the tick bite.   Tender, swollen lymph glands.   Diarrhea.   Weight loss.   Cough.   Fatigue.   Muscle, joint, or bone pain.   Abdominal pain.   Headache.   Lethargy or a change in your level of consciousness.  Difficulty walking or moving your legs.   Numbness in the legs.   Paralysis.  Shortness of breath.   Confusion.   Repeated vomiting.  Document Released: 10/12/2000 Document Revised: 08/05/2013 Document Reviewed: 03/25/2013 [employer] Patient Information 2014 [employer].  

## 2014-04-09 ENCOUNTER — Encounter: Payer: Self-pay | Admitting: Cardiovascular Disease

## 2014-04-09 ENCOUNTER — Ambulatory Visit (INDEPENDENT_AMBULATORY_CARE_PROVIDER_SITE_OTHER): Payer: 59 | Admitting: Cardiovascular Disease

## 2014-04-09 VITALS — BP 140/80 | HR 76 | Ht 73.0 in | Wt 207.5 lb

## 2014-04-09 DIAGNOSIS — I447 Left bundle-branch block, unspecified: Secondary | ICD-10-CM

## 2014-04-09 DIAGNOSIS — I10 Essential (primary) hypertension: Secondary | ICD-10-CM

## 2014-04-09 DIAGNOSIS — E782 Mixed hyperlipidemia: Secondary | ICD-10-CM

## 2014-04-09 DIAGNOSIS — I428 Other cardiomyopathies: Secondary | ICD-10-CM

## 2014-04-09 DIAGNOSIS — I429 Cardiomyopathy, unspecified: Secondary | ICD-10-CM

## 2014-04-09 DIAGNOSIS — R9431 Abnormal electrocardiogram [ECG] [EKG]: Secondary | ICD-10-CM

## 2014-04-09 NOTE — Patient Instructions (Signed)
Your physician has requested that you have an echocardiogram. Echocardiography is a painless test that uses sound waves to create images of your heart. It provides your doctor with information about the size and shape of your heart and how well your heart's chambers and valves are working. This procedure takes approximately one hour. There are no restrictions for this procedure.  Dr. Croitoru recommends that you schedule a follow-up appointment in: ONE YEAR   

## 2014-04-10 ENCOUNTER — Encounter: Payer: Self-pay | Admitting: Cardiovascular Disease

## 2014-04-10 NOTE — Assessment & Plan Note (Signed)
Asymptomatic, good stamina and exercise tolerance. NYHAclass I. Extensive workup was negative. 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 Will get echo follow up periodically. He is already on ACEi and beta blockers and does not require diuretics.

## 2014-04-10 NOTE — Assessment & Plan Note (Signed)
On rosuvastatin with fair, albeit not ideal reduction in LDL and normal TG level.

## 2014-04-10 NOTE — Assessment & Plan Note (Signed)
Good control
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
## 2014-04-15 ENCOUNTER — Encounter: Payer: Self-pay | Admitting: Cardiovascular Disease

## 2014-05-10 ENCOUNTER — Ambulatory Visit (HOSPITAL_COMMUNITY)
Admission: RE | Admit: 2014-05-10 | Discharge: 2014-05-10 | Disposition: A | Discharge status: Home / Self Care | Payer: 59 | Source: Ambulatory Visit | Attending: Cardiology | Admitting: Cardiology

## 2014-05-10 DIAGNOSIS — I428 Other cardiomyopathies: Secondary | ICD-10-CM | POA: Insufficient documentation

## 2014-05-10 DIAGNOSIS — I517 Cardiomegaly: Secondary | ICD-10-CM

## 2014-05-10 DIAGNOSIS — I447 Left bundle-branch block, unspecified: Secondary | ICD-10-CM

## 2014-05-10 DIAGNOSIS — I429 Cardiomyopathy, unspecified: Secondary | ICD-10-CM

## 2014-05-10 NOTE — Progress Notes (Signed)
2D Echocardiogram Complete.  05/10/2014   Estevon Fluke, RDCS  

## 2014-05-12 ENCOUNTER — Other Ambulatory Visit: Payer: Self-pay | Admitting: Internal Medicine

## 2014-06-29 ENCOUNTER — Encounter: Payer: Self-pay | Admitting: Internal Medicine

## 2014-08-04 ENCOUNTER — Encounter: Payer: Self-pay | Admitting: Internal Medicine

## 2014-08-04 ENCOUNTER — Ambulatory Visit (INDEPENDENT_AMBULATORY_CARE_PROVIDER_SITE_OTHER): Payer: 59 | Admitting: Internal Medicine

## 2014-08-04 VITALS — BP 132/80 | HR 60 | Temp 98.8°F | Resp 16 | Ht 74.0 in | Wt 212.4 lb

## 2014-08-04 DIAGNOSIS — E079 Disorder of thyroid, unspecified: Secondary | ICD-10-CM

## 2014-08-04 DIAGNOSIS — Z79899 Other long term (current) drug therapy: Secondary | ICD-10-CM | POA: Insufficient documentation

## 2014-08-04 DIAGNOSIS — I10 Essential (primary) hypertension: Secondary | ICD-10-CM

## 2014-08-04 DIAGNOSIS — Z1212 Encounter for screening for malignant neoplasm of rectum: Secondary | ICD-10-CM

## 2014-08-04 DIAGNOSIS — R7989 Other specified abnormal findings of blood chemistry: Secondary | ICD-10-CM

## 2014-08-04 DIAGNOSIS — E782 Mixed hyperlipidemia: Secondary | ICD-10-CM

## 2014-08-04 DIAGNOSIS — R7303 Prediabetes: Secondary | ICD-10-CM

## 2014-08-04 DIAGNOSIS — Z125 Encounter for screening for malignant neoplasm of prostate: Secondary | ICD-10-CM

## 2014-08-04 DIAGNOSIS — Z91013 Allergy to seafood: Secondary | ICD-10-CM

## 2014-08-04 DIAGNOSIS — R6889 Other general symptoms and signs: Secondary | ICD-10-CM

## 2014-08-04 DIAGNOSIS — Z0001 Encounter for general adult medical examination with abnormal findings: Secondary | ICD-10-CM

## 2014-08-04 DIAGNOSIS — Z113 Encounter for screening for infections with a predominantly sexual mode of transmission: Secondary | ICD-10-CM

## 2014-08-04 DIAGNOSIS — I447 Left bundle-branch block, unspecified: Secondary | ICD-10-CM

## 2014-08-04 DIAGNOSIS — R945 Abnormal results of liver function studies: Secondary | ICD-10-CM

## 2014-08-04 DIAGNOSIS — E559 Vitamin D deficiency, unspecified: Secondary | ICD-10-CM

## 2014-08-04 DIAGNOSIS — Z111 Encounter for screening for respiratory tuberculosis: Secondary | ICD-10-CM

## 2014-08-04 DIAGNOSIS — Z23 Encounter for immunization: Secondary | ICD-10-CM

## 2014-08-04 LAB — CBC WITH DIFFERENTIAL/PLATELET
Basophils Absolute: 0 10*3/uL (ref 0.0–0.1)
Basophils Relative: 0 % (ref 0–1)
EOS PCT: 2 % (ref 0–5)
Eosinophils Absolute: 0.1 10*3/uL (ref 0.0–0.7)
HCT: 45.6 % (ref 39.0–52.0)
HEMOGLOBIN: 16 g/dL (ref 13.0–17.0)
LYMPHS ABS: 2 10*3/uL (ref 0.7–4.0)
Lymphocytes Relative: 35 % (ref 12–46)
MCH: 29.9 pg (ref 26.0–34.0)
MCHC: 35.1 g/dL (ref 30.0–36.0)
MCV: 85.2 fL (ref 78.0–100.0)
MONO ABS: 0.5 10*3/uL (ref 0.1–1.0)
Monocytes Relative: 8 % (ref 3–12)
NEUTROS ABS: 3.1 10*3/uL (ref 1.7–7.7)
Neutrophils Relative %: 55 % (ref 43–77)
Platelets: 242 10*3/uL (ref 150–400)
RBC: 5.35 MIL/uL (ref 4.22–5.81)
RDW: 13.6 % (ref 11.5–15.5)
WBC: 5.7 10*3/uL (ref 4.0–10.5)
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
## 2014-08-04 NOTE — Patient Instructions (Signed)
Recommend the book "The END of DIETING" by Dr Baker Janus   and the book "The END of DIABETES " by Dr Excell Seltzer  At [email or web address] - get book & Audio CD's      Being diabetic has a  300% increased risk for heart attack, stroke, cancer, and alzheimer- type vascular dementia. It is very important that you work harder with diet by avoiding all foods that are white except chicken & fish. Avoid white rice (brown & wild rice is OK), white potatoes (sweetpotatoes in moderation is OK), White bread or wheat bread or anything made out of white flour like bagels, donuts, rolls, buns, biscuits, cakes, pastries, cookies, pizza crust, and pasta (made from white flour & egg whites) - vegetarian pasta or spinach or wheat pasta is OK. Multigrain breads like Arnold's or [employer], or multigrain sandwich thins or flatbreads.  Diet, exercise and weight loss can reverse and cure diabetes in the early stages.  Diet, exercise and weight loss is very important in the control and prevention of complications of diabetes which affects every system in your body, ie. Brain - dementia/stroke, eyes - glaucoma/blindness, heart - heart attack/heart failure, kidneys - dialysis, stomach - gastric paralysis, intestines - malabsorption, nerves - severe painful neuritis, circulation - gangrene & loss of a leg(s), and finally cancer and Alzheimers.    I recommend avoid fried & greasy foods,  sweets/candy, white rice (brown or wild rice or Quinoa is OK), white potatoes (sweet potatoes are OK) - anything made from white flour - bagels, doughnuts, rolls, buns, biscuits,white and wheat breads, pizza crust and traditional pasta made of white flour & egg white(vegetarian pasta or spinach or wheat pasta is OK).  Multi-grain bread is OK - like multi-grain flat bread or sandwich thins. Avoid alcohol in excess. Exercise is also important.    Eat all the vegetables you want - avoid meat, especially red meat and dairy - especially cheese.  Cheese  is the most concentrated form of trans-fats which is the worst thing to clog up our arteries. Veggie cheese is OK which can be found in the fresh produce section at [employer] or [employer] or [employer]  Preventive Care for Adults A healthy lifestyle and preventive care can promote health and wellness. Preventive health guidelines for men include the following key practices:  A routine yearly physical is a good way to check with your health care provider about your health and preventative screening. It is a chance to share any concerns and updates on your health and to receive a thorough exam.  Visit your dentist for a routine exam and preventative care every 6 months. Brush your teeth twice a day and floss once a day. Good oral hygiene prevents tooth decay and gum disease.  The frequency of eye exams is based on your age, health, family medical history, use of contact lenses, and other factors. Follow your health care provider's recommendations for frequency of eye exams.  Eat a healthy diet. Foods such as vegetables, fruits, whole grains, low-fat dairy products, and lean protein foods contain the nutrients you need without too many calories. Decrease your intake of foods high in solid fats, added sugars, and salt. Eat the right amount of calories for you.Get information about a proper diet from your health care provider, if necessary.  Regular physical exercise is one of the most important things you can do for your health. Most adults should get at least 150 minutes of moderate-intensity exercise (any activity that  increases your heart rate and causes you to sweat) each week. In addition, most adults need muscle-strengthening exercises on 2 or more days a week.  Maintain a healthy weight. The body mass index (BMI) is a screening tool to identify possible weight problems. It provides an estimate of body fat based on height and weight. Your health care provider can find your BMI and can help you  achieve or maintain a healthy weight.For adults 20 years and older:  A BMI below 18.5 is considered underweight.  A BMI of 18.5 to 24.9 is normal.  A BMI of 25 to 29.9 is considered overweight.  A BMI of 30 and above is considered obese.  Maintain normal blood lipids and cholesterol levels by exercising and minimizing your intake of saturated fat. Eat a balanced diet with plenty of fruit and vegetables. Blood tests for lipids and cholesterol should begin at age 20 and be repeated every 5 years. If your lipid or cholesterol levels are high, you are over 50, or you are at high risk for heart disease, you may need your cholesterol levels checked more frequently.Ongoing high lipid and cholesterol levels should be treated with medicines if diet and exercise are not working.  If you smoke, find out from your health care provider how to quit. If you do not use tobacco, do not start.  Lung cancer screening is recommended for adults aged 72-80 years who are at high risk for developing lung cancer because of a history of smoking. A yearly low-dose CT scan of the lungs is recommended for people who have at least a 30-pack-year history of smoking and are a current smoker or have quit within the past 15 years. A pack year of smoking is smoking an average of 1 pack of cigarettes a day for 1 year (for example: 1 pack a day for 30 years or 2 packs a day for 15 years). Yearly screening should continue until the smoker has stopped smoking for at least 15 years. Yearly screening should be stopped for people who develop a health problem that would prevent them from having lung cancer treatment.  If you choose to drink alcohol, do not have more than 2 drinks per day. One drink is considered to be 12 ounces (355 mL) of beer, 5 ounces (148 mL) of wine, or 1.5 ounces (44 mL) of liquor.  Avoid use of street drugs. Do not share needles with anyone. Ask for help if you need support or instructions about stopping the use of  drugs.  High blood pressure causes heart disease and increases the risk of stroke. Your blood pressure should be checked at least every 1-2 years. Ongoing high blood pressure should be treated with medicines, if weight loss and exercise are not effective.  If you are 28-64 years old, ask your health care provider if you should take aspirin to prevent heart disease.  Diabetes screening involves taking a blood sample to check your fasting blood sugar level. This should be done once every 3 years, after age 13, if you are within normal weight and without risk factors for diabetes. Testing should be considered at a younger age or be carried out more frequently if you are overweight and have at least 1 risk factor for diabetes.  Colorectal cancer can be detected and often prevented. Most routine colorectal cancer screening begins at the age of 78 and continues through age 56. However, your health care provider may recommend screening at an earlier age if you have risk  factors for colon cancer. On a yearly basis, your health care provider may provide home test kits to check for hidden blood in the stool. Use of a small camera at the end of a tube to directly examine the colon (sigmoidoscopy or colonoscopy) can detect the earliest forms of colorectal cancer. Talk to your health care provider about this at age 67, when routine screening begins. Direct exam of the colon should be repeated every 5-10 years through age 70, unless early forms of precancerous polyps or small growths are found.  People who are at an increased risk for hepatitis B should be screened for this virus. You are considered at high risk for hepatitis B if:  You were born in a country where hepatitis B occurs often. Talk with your health care provider about which countries are considered high risk.  Your parents were born in a high-risk country and you have not received a shot to protect against hepatitis B (hepatitis B vaccine).  You have  HIV or AIDS.  You use needles to inject street drugs.  You live with, or have sex with, someone who has hepatitis B.  You are a man who has sex with other men (MSM).  You get hemodialysis treatment.  You take certain medicines for conditions such as cancer, organ transplantation, and autoimmune conditions.  Hepatitis C blood testing is recommended for all people born from [birth year] through 1965 and any individual with known risks for hepatitis C.  Practice safe sex. Use condoms and avoid high-risk sexual practices to reduce the spread of sexually transmitted infections (STIs). STIs include gonorrhea, chlamydia, syphilis, trichomonas, herpes, HPV, and human immunodeficiency virus (HIV). Herpes, HIV, and HPV are viral illnesses that have no cure. They can result in disability, cancer, and death.  If you are at risk of being infected with HIV, it is recommended that you take a prescription medicine daily to prevent HIV infection. This is called preexposure prophylaxis (PrEP). You are considered at risk if:  You are a man who has sex with other men (MSM) and have other risk factors.  You are a heterosexual man, are sexually active, and are at increased risk for HIV infection.  You take drugs by injection.  You are sexually active with a partner who has HIV.  Talk with your health care provider about whether you are at high risk of being infected with HIV. If you choose to begin PrEP, you should first be tested for HIV. You should then be tested every 3 months for as long as you are taking PrEP.    Healthy men should no longer receive prostate-specific antigen (PSA) blood tests as part of routine cancer screening. Talk with your health care provider about prostate cancer screening.  Testicular cancer screening is not recommended for adult males who have no symptoms. Screening includes self-exam, a health care provider exam, and other screening tests. Consult with your health care provider about  any symptoms you have or any concerns you have about testicular cancer.  Use sunscreen. Apply sunscreen liberally and repeatedly throughout the day. You should seek shade when your shadow is shorter than you. Protect yourself by wearing long sleeves, pants, a wide-brimmed hat, and sunglasses year round, whenever you are outdoors.  Once a month, do a whole-body skin exam, using a mirror to look at the skin on your back. Tell your health care provider about new moles, moles that have irregular borders, moles that are larger than a pencil eraser, or moles that  have changed in shape or color.  Stay current with required vaccines (immunizations).  Influenza vaccine. All adults should be immunized every year.  Tetanus, diphtheria, and acellular pertussis (Td, Tdap) vaccine. An adult who has not previously received Tdap or who does not know his vaccine status should receive 1 dose of Tdap. This initial dose should be followed by tetanus and diphtheria toxoids (Td) booster doses every 10 years. Adults with an unknown or incomplete history of completing a 3-dose immunization series with Td-containing vaccines should begin or complete a primary immunization series including a Tdap dose. Adults should receive a Td booster every 10 years.  Varicella vaccine. An adult without evidence of immunity to varicella should receive 2 doses or a second dose if he has previously received 1 dose.  Human papillomavirus (HPV) vaccine. Males aged 84-21 years who have not received the vaccine previously should receive the 3-dose series. Males aged 22-26 years may be immunized. Immunization is recommended through the age of 75 years for any male who has sex with males and did not get any or all doses earlier. Immunization is recommended for any person with an immunocompromised condition through the age of 50 years if he did not get any or all doses earlier. During the 3-dose series, the second dose should be obtained 4-8 weeks  after the first dose. The third dose should be obtained 24 weeks after the first dose and 16 weeks after the second dose.  Zoster vaccine. One dose is recommended for adults aged 18 years or older unless certain conditions are present.  Measles, mumps, and rubella (MMR) vaccine. Adults born before [birth year] generally are considered immune to measles and mumps. Adults born in [birth year] or later should have 1 or more doses of MMR vaccine unless there is a contraindication to the vaccine or there is laboratory evidence of immunity to each of the three diseases. A routine second dose of MMR vaccine should be obtained at least 28 days after the first dose for students attending postsecondary schools, health care workers, or international travelers. People who received inactivated measles vaccine or an unknown type of measles vaccine during 1963-1967 should receive 2 doses of MMR vaccine. People who received inactivated mumps vaccine or an unknown type of mumps vaccine before 1979 and are at high risk for mumps infection should consider immunization with 2 doses of MMR vaccine. Unvaccinated health care workers born before [birth year] who lack laboratory evidence of measles, mumps, or rubella immunity or laboratory confirmation of disease should consider measles and mumps immunization with 2 doses of MMR vaccine or rubella immunization with 1 dose of MMR vaccine.  Pneumococcal 13-valent conjugate (PCV13) vaccine. When indicated, a person who is uncertain of his immunization history and has no record of immunization should receive the PCV13 vaccine. An adult aged 60 years or older who has certain medical conditions and has not been previously immunized should receive 1 dose of PCV13 vaccine. This PCV13 should be followed with a dose of pneumococcal polysaccharide (PPSV23) vaccine. The PPSV23 vaccine dose should be obtained at least 8 weeks after the dose of PCV13 vaccine. An adult aged 87 years or older who has certain medical  conditions and previously received 1 or more doses of PPSV23 vaccine should receive 1 dose of PCV13. The PCV13 vaccine dose should be obtained 1 or more years after the last PPSV23 vaccine dose.  Pneumococcal polysaccharide (PPSV23) vaccine. When PCV13 is also indicated, PCV13 should be obtained first. All adults aged 69 years and  older should be immunized. An adult younger than age 67 years who has certain medical conditions should be immunized. Any person who resides in a nursing home or long-term care facility should be immunized. An adult smoker should be immunized. People with an immunocompromised condition and certain other conditions should receive both PCV13 and PPSV23 vaccines. People with human immunodeficiency virus (HIV) infection should be immunized as soon as possible after diagnosis. Immunization during chemotherapy or radiation therapy should be avoided. Routine use of PPSV23 vaccine is not recommended for American Indians, [place] Natives, or people younger than 65 years unless there are medical conditions that require PPSV23 vaccine. When indicated, people who have unknown immunization and have no record of immunization should receive PPSV23 vaccine. One-time revaccination 5 years after the first dose of PPSV23 is recommended for people aged 19-64 years who have chronic kidney failure, nephrotic syndrome, asplenia, or immunocompromised conditions. People who received 1-2 doses of PPSV23 before age 49 years should receive another dose of PPSV23 vaccine at age 18 years or later if at least 5 years have passed since the previous dose. Doses of PPSV23 are not needed for people immunized with PPSV23 at or after age 45 years.  Meningococcal vaccine. Adults with asplenia or persistent complement component deficiencies should receive 2 doses of quadrivalent meningococcal conjugate (MenACWY-D) vaccine. The doses should be obtained at least 2 months apart. Microbiologists working with certain  meningococcal bacteria, [place] recruits, people at risk during an outbreak, and people who travel to or live in countries with a high rate of meningitis should be immunized. A first-year college student up through age 49 years who is living in a residence hall should receive a dose if he did not receive a dose on or after his 16th birthday. Adults who have certain high-risk conditions should receive one or more doses of vaccine.  Hepatitis A vaccine. Adults who wish to be protected from this disease, have certain high-risk conditions, work with hepatitis A-infected animals, work in hepatitis A research labs, or travel to or work in countries with a high rate of hepatitis A should be immunized. Adults who were previously unvaccinated and who anticipate close contact with an international adoptee during the first 60 days after arrival in the [place] from a country with a high rate of hepatitis A should be immunized.  Hepatitis B vaccine. Adults should be immunized if they wish to be protected from this disease, have certain high-risk conditions, may be exposed to blood or other infectious body fluids, are household contacts or sex partners of hepatitis B positive people, are clients or workers in certain care facilities, or travel to or work in countries with a high rate of hepatitis B.  Haemophilus influenzae type b (Hib) vaccine. A previously unvaccinated person with asplenia or sickle cell disease or having a scheduled splenectomy should receive 1 dose of Hib vaccine. Regardless of previous immunization, a recipient of a hematopoietic stem cell transplant should receive a 3-dose series 6-12 months after his successful transplant. Hib vaccine is not recommended for adults with HIV infection. Preventive Service / Frequency  \Ages 40 to 64  Blood pressure check.** / Every 1 to 2 years.  Lipid and cholesterol check.** / Every 5 years beginning at age 87.  Lung cancer screening. / Every year if  you are aged 58-80 years and have a 30-pack-year history of smoking and currently smoke or have quit within the past 15 years. Yearly screening is stopped once you have quit smoking for  at least 15 years or develop a health problem that would prevent you from having lung cancer treatment.  Fecal occult blood test (FOBT) of stool. / Every year beginning at age 41 and continuing until age 9. You may not have to do this test if you get a colonoscopy every 10 years.  Flexible sigmoidoscopy** or colonoscopy.** / Every 5 years for a flexible sigmoidoscopy or every 10 years for a colonoscopy beginning at age 4 and continuing until age 24.  Hepatitis C blood test.** / For all people born from [birth year] through 1963 and any individual with known risks for hepatitis C.  Skin self-exam. / Monthly.  Influenza vaccine. / Every year.  Tetanus, diphtheria, and acellular pertussis (Tdap/Td) vaccine.** / Consult your health care provider. 1 dose of Td every 10 years.  Zoster vaccine.** / 1 dose for adults aged 68 years or older.    Pneumococcal 13-valent conjugate (PCV13) vaccine.** / Consult your health care provider.  Pneumococcal polysaccharide (PPSV23) vaccine.** / 1 to 2 doses if you smoke cigarettes or if you have certain conditions.

## 2014-08-05 LAB — LIPID PANEL
Cholesterol: 163 mg/dL (ref 0–200)
HDL: 50 mg/dL (ref >39)
LDL Cholesterol: 76 mg/dL (ref 0–99)
TRIGLYCERIDES: 184 mg/dL — AB (ref <150)
Total CHOL/HDL Ratio: 3.3 Ratio
VLDL: 37 mg/dL (ref 0–40)

## 2014-08-05 LAB — HEPATITIS A ANTIBODY, TOTAL: HEP A TOTAL AB: NONREACTIVE

## 2014-08-05 LAB — TSH: TSH: 1.62 u[IU]/mL (ref 0.350–4.500)

## 2014-08-05 LAB — BASIC METABOLIC PANEL WITH GFR
BUN: 12 mg/dL (ref 6–23)
CO2: 30 meq/L (ref 19–32)
CREATININE: 0.94 mg/dL (ref 0.50–1.35)
Calcium: 9.3 mg/dL (ref 8.4–10.5)
Chloride: 98 mEq/L (ref 96–112)
GFR, Est African American: >89 mL/min
Glucose, Bld: 91 mg/dL (ref 70–99)
Potassium: 4.7 mEq/L (ref 3.5–5.3)
Sodium: 136 mEq/L (ref 135–145)

## 2014-08-05 LAB — HEPATITIS B CORE ANTIBODY, TOTAL: Hep B Core Total Ab: NONREACTIVE

## 2014-08-05 LAB — HEMOGLOBIN A1C
Hgb A1c MFr Bld: 5.7 % — ABNORMAL HIGH (ref <5.7)
Mean Plasma Glucose: 117 mg/dL — ABNORMAL HIGH (ref <117)

## 2014-08-05 LAB — HEPATITIS B SURFACE ANTIBODY,QUALITATIVE: Hep B S Ab: POSITIVE — AB

## 2014-08-05 LAB — HEPATIC FUNCTION PANEL
ALBUMIN: 4.7 g/dL (ref 3.5–5.2)
ALK PHOS: 79 U/L (ref 39–117)
ALT: 30 U/L (ref 0–53)
AST: 19 U/L (ref 0–37)
BILIRUBIN TOTAL: 0.5 mg/dL (ref 0.2–1.2)
Bilirubin, Direct: 0.1 mg/dL (ref 0.0–0.3)
Indirect Bilirubin: 0.4 mg/dL (ref 0.2–1.2)
Total Protein: 7.2 g/dL (ref 6.0–8.3)

## 2014-08-05 LAB — MAGNESIUM: Magnesium: 2.3 mg/dL (ref 1.5–2.5)

## 2014-08-05 LAB — MICROALBUMIN / CREATININE URINE RATIO
CREATININE, URINE: 56.2 mg/dL
MICROALB UR: 7.8 mg/dL — AB (ref <2.0)
Microalb Creat Ratio: 138.8 mg/g — ABNORMAL HIGH (ref 0.0–30.0)

## 2014-08-05 LAB — TESTOSTERONE: TESTOSTERONE: 381 ng/dL (ref 300–890)

## 2014-08-05 LAB — HEPATITIS C ANTIBODY: HCV Ab: NEGATIVE

## 2014-08-05 LAB — PSA: PSA: 1.34 ng/mL (ref <=4.00)

## 2014-08-05 LAB — VITAMIN B12: VITAMIN B 12: 531 pg/mL (ref 211–911)

## 2014-08-05 LAB — VITAMIN D 25 HYDROXY (VIT D DEFICIENCY, FRACTURES): Vit D, 25-Hydroxy: 84 ng/mL (ref 30–89)

## 2014-08-05 LAB — INSULIN, FASTING: Insulin fasting, serum: 13.4 u[IU]/mL (ref 2.0–19.6)

## 2014-08-05 LAB — RPR

## 2014-08-05 LAB — HIV ANTIBODY (ROUTINE TESTING W REFLEX): HIV 1&2 Ab, 4th Generation: NONREACTIVE

## 2014-08-06 LAB — HEPATITIS B E ANTIBODY: Hepatitis Be Antibody: NONREACTIVE

## 2014-08-06 LAB — TB SKIN TEST
Induration: 0 mm
TB Skin Test: NEGATIVE

## 2014-08-15 ENCOUNTER — Other Ambulatory Visit: Payer: Self-pay | Admitting: Internal Medicine

## 2014-08-18 ENCOUNTER — Other Ambulatory Visit: Payer: Self-pay | Admitting: Internal Medicine

## 2014-11-10 ENCOUNTER — Other Ambulatory Visit: Payer: Self-pay | Admitting: *Deleted

## 2014-11-10 MED ORDER — LEVOTHYROXINE SODIUM 50 MCG PO TABS: 50.0000 ug | ORAL_TABLET | Freq: Every day | ORAL | Status: DC

## 2014-12-10 ENCOUNTER — Ambulatory Visit: Payer: Self-pay | Admitting: Physician Assistant

## 2015-03-10 ENCOUNTER — Ambulatory Visit: Payer: Self-pay | Admitting: Internal Medicine

## 2015-05-17 ENCOUNTER — Other Ambulatory Visit: Payer: Self-pay | Admitting: Internal Medicine

## 2015-06-10 ENCOUNTER — Encounter: Payer: Self-pay | Admitting: Physician Assistant

## 2015-06-10 ENCOUNTER — Ambulatory Visit (INDEPENDENT_AMBULATORY_CARE_PROVIDER_SITE_OTHER): Payer: 59 | Admitting: Physician Assistant

## 2015-06-10 VITALS — BP 138/80 | HR 60 | Temp 98.1°F | Resp 14 | Wt 207.0 lb

## 2015-06-10 DIAGNOSIS — I10 Essential (primary) hypertension: Secondary | ICD-10-CM | POA: Diagnosis not present

## 2015-06-10 DIAGNOSIS — R7309 Other abnormal glucose: Secondary | ICD-10-CM | POA: Diagnosis not present

## 2015-06-10 DIAGNOSIS — Z79899 Other long term (current) drug therapy: Secondary | ICD-10-CM

## 2015-06-10 DIAGNOSIS — E559 Vitamin D deficiency, unspecified: Secondary | ICD-10-CM

## 2015-06-10 DIAGNOSIS — E079 Disorder of thyroid, unspecified: Secondary | ICD-10-CM

## 2015-06-10 DIAGNOSIS — E782 Mixed hyperlipidemia: Secondary | ICD-10-CM

## 2015-06-10 DIAGNOSIS — R7303 Prediabetes: Secondary | ICD-10-CM

## 2015-06-10 DIAGNOSIS — L570 Actinic keratosis: Secondary | ICD-10-CM | POA: Diagnosis not present

## 2015-06-10 LAB — CBC WITH DIFFERENTIAL/PLATELET
Basophils Absolute: 0 10*3/uL (ref 0.0–0.1)
Basophils Relative: 0 % (ref 0–1)
EOS PCT: 2 % (ref 0–5)
Eosinophils Absolute: 0.1 10*3/uL (ref 0.0–0.7)
HCT: 45.4 % (ref 39.0–52.0)
HEMOGLOBIN: 15.9 g/dL (ref 13.0–17.0)
LYMPHS ABS: 1.8 10*3/uL (ref 0.7–4.0)
Lymphocytes Relative: 38 % (ref 12–46)
MCH: 30.2 pg (ref 26.0–34.0)
MCHC: 35 g/dL (ref 30.0–36.0)
MCV: 86.1 fL (ref 78.0–100.0)
MONO ABS: 0.5 10*3/uL (ref 0.1–1.0)
MONOS PCT: 10 % (ref 3–12)
MPV: 10.1 fL (ref 8.6–12.4)
Neutro Abs: 2.4 10*3/uL (ref 1.7–7.7)
Neutrophils Relative %: 50 % (ref 43–77)
Platelets: 238 10*3/uL (ref 150–400)
RBC: 5.27 MIL/uL (ref 4.22–5.81)
RDW: 13.5 % (ref 11.5–15.5)
WBC: 4.7 10*3/uL (ref 4.0–10.5)

## 2015-06-10 LAB — LIPID PANEL
CHOL/HDL RATIO: 2.9 ratio (ref <=5.0)
Cholesterol: 158 mg/dL (ref 125–200)
HDL: 54 mg/dL (ref >=40)
LDL Cholesterol: 91 mg/dL (ref <130)
Triglycerides: 67 mg/dL (ref <150)
VLDL: 13 mg/dL (ref <30)

## 2015-06-10 LAB — BASIC METABOLIC PANEL WITH GFR
BUN: 13 mg/dL (ref 7–25)
CALCIUM: 9.4 mg/dL (ref 8.6–10.3)
CO2: 28 mmol/L (ref 20–31)
Chloride: 100 mmol/L (ref 98–110)
Creat: 1.01 mg/dL (ref 0.70–1.33)
GFR, Est African American: >89 mL/min (ref >=60)
GFR, Est Non African American: 82 mL/min (ref >=60)
GLUCOSE: 89 mg/dL (ref 65–99)
Potassium: 4.7 mmol/L (ref 3.5–5.3)
SODIUM: 138 mmol/L (ref 135–146)

## 2015-06-10 LAB — HEPATIC FUNCTION PANEL
ALBUMIN: 4.5 g/dL (ref 3.6–5.1)
ALK PHOS: 77 U/L (ref 40–115)
ALT: 30 U/L (ref 9–46)
AST: 20 U/L (ref 10–35)
BILIRUBIN INDIRECT: 0.4 mg/dL (ref 0.2–1.2)
BILIRUBIN TOTAL: 0.6 mg/dL (ref 0.2–1.2)
Bilirubin, Direct: 0.2 mg/dL (ref <=0.2)
TOTAL PROTEIN: 7 g/dL (ref 6.1–8.1)

## 2015-06-10 LAB — MAGNESIUM: Magnesium: 2 mg/dL (ref 1.5–2.5)

## 2015-06-10 LAB — TSH: TSH: 1.641 u[IU]/mL (ref 0.350–4.500)

## 2015-06-10 LAB — HEMOGLOBIN A1C
Hgb A1c MFr Bld: 5.6 % (ref <5.7)
Mean Plasma Glucose: 114 mg/dL (ref <117)

## 2015-06-10 LAB — VITAMIN D 25 HYDROXY (VIT D DEFICIENCY, FRACTURES): VIT D 25 HYDROXY: 59 ng/mL (ref 30–100)

## 2015-06-10 NOTE — Progress Notes (Signed)
3 month follow up  Assessment and Plan:  Hypertension: Continue medication, monitor blood pressure at home. Continue DASH diet. Cholesterol: Continue diet and exercise. Check cholesterol.  Pre-diabetes-Continue diet and exercise. Check A1C Vitamin D Def- check level and continue medications.  Hypothyroidism-check TSH level, continue medications the same, reminded to take on an empty stomach 30-66mins before food.  Actinic keratosis- 3 freeze and thaw technique, to several areas on right temple, left temple, and left hand. Marland Kitchen    HPI Patient presents for 9 month follow up with hypertension, hyperlipidemia, prediabetes and vitamin D. Patient's blood pressure has been controlled at home, BP: 138/80 mmHg  Patient denies chest pain, shortness of breath, dizziness.  Patient's cholesterol is diet controlled. In addition they are on crestor 20  and denies myalgias. The cholesterol last visit was Lab Results  Component Value Date   CHOL 163 08/04/2014   HDL 50 08/04/2014   LDLCALC 76 08/04/2014   TRIG 184* 08/04/2014   CHOLHDL 3.3 08/04/2014   The patient has been working on diet and exercise for prediabetes, and denies changes in vision, polys, and paresthesias.  Lab Results  Component Value Date   HGBA1C 5.7* 08/04/2014   Patient is on Vitamin D supplement.  Lab Results  Component Value Date   VD25OH 24 08/04/2014   He is on thyroid medication. His medication was not changed last visit.   Lab Results  Component Value Date   TSH 1.620 08/04/2014  .   Current Medications:  Current Outpatient Prescriptions on File Prior to Visit  Medication Sig Dispense Refill  . aspirin 81 MG tablet Take 81 mg by mouth daily.    . Cholecalciferol (VITAMIN D PO) Take 5,000 Units by mouth daily.    . CRESTOR 20 MG tablet TAKE 1 TABLET EVERY DAY 30 tablet 3  . enalapril (VASOTEC) 20 MG tablet TAKE 1 TABLET EVERY DAY FOR BLOOD PRESSURE 90 tablet 3  . levothyroxine (SYNTHROID, LEVOTHROID) 50 MCG tablet  TAKE 1 TABLET (50 MCG TOTAL) BY MOUTH DAILY. 30 tablet 1  . Magnesium 250 MG TABS Take 250 mg by mouth daily.    . Multiple Vitamins-Minerals (MULTIVITAMIN PO) Take by mouth.     No current facility-administered medications on file prior to visit.   Medical History:  Past Medical History  Diagnosis Date  . Hypertension   . Hyperlipidemia   . Thyroid disease     HYPO  . Varicose veins   . Vitamin D deficiency   . Prediabetes   . LBBB (left bundle branch block)    Allergies:  Allergies  Allergen Reactions  . Simvastatin   . Iodine Rash    Review of Systems  Constitutional: Negative.   HENT: Negative.   Eyes: Negative.   Respiratory: Negative.   Cardiovascular: Negative.   Gastrointestinal: Negative.   Genitourinary: Negative.   Musculoskeletal: Negative.   Skin: Positive for rash. Negative for itching.  Neurological: Negative.   Endo/Heme/Allergies: Negative.   Psychiatric/Behavioral: Negative.    Family history- Review and unchanged Social history- Review and unchanged Physical Exam: Filed Vitals:   06/10/15 0840  BP: 138/80  Pulse: 60  Temp: 98.1 F (36.7 C)  Resp: 14   Filed Weights   06/10/15 0840  Weight: 207 lb (93.895 kg)   General Appearance: Well nourished, in no apparent distress. Eyes: PERRLA, EOMs, conjunctiva no swelling or erythema, normal fundi and vessels. Sinuses: No Frontal/maxillary tenderness ENT/Mouth: Ext aud canals clear, with TMs without erythema, bulging.No erythema, swelling,  or exudate on post pharynx.  Tonsils not swollen or erythematous. Hearing normal.  Neck: Supple, thyroid normal.  Respiratory: Respiratory effort normal, BS equal bilaterally without rales, rhonchi, wheezing or stridor.  Cardio: Heart sounds normal, regular rate and rhythm without murmurs, rubs or gallops. Peripheral pulses brisk and equal bilaterally, without edema.  Abdomen: Flat, soft, with bowel sounds. Non tender, no guarding, rebound, hernias, masses, or  organomegaly.  Lymphatics: Non tender without lymphadenopathy.  Musculoskeletal: Full ROM all peripheral extremities, joint stability, 5/5 strength, and normal gait. Skin: Scaly erythematous plaques on bilateral temples, face, and two warts on hand. Warm, dry without rashes, lesions, ecchymosis.  Neuro: Cranial nerves intact, reflexes equal bilaterally. Normal muscle tone, no cerebellar symptoms. Sensation intact.  Psych: Awake and oriented X 3, normal affect, Insight and Judgment appropriate.    Gerald [name] 8:42 AM

## 2015-06-10 NOTE — Patient Instructions (Signed)
Add ENTERIC COATED low dose 81 mg Aspirin daily OR can do every other day if you have easy bruising to protect your heart and head. As well as to reduce risk of Colon Cancer by 20 %, Skin Cancer by 26 % , Melanoma by 46% and Pancreatic cancer by 60%   Vitamin D goal is between 60-80  Please make sure that you are taking your Vitamin D as directed.   It is very important as a natural anti-inflammatory   helping hair, skin, and nails, as well as reducing stroke and heart attack risk.   It helps your bones and helps with mood.  It also decreases numerous cancer risks so please take it as directed.   Low Vit D is associated with a 200-300% higher risk for CANCER   and 200-300% higher risk for HEART   ATTACK  &  STROKE.    .....................................Marland Kitchen  It is also associated with higher death rate at younger ages,   autoimmune diseases like Rheumatoid arthritis, Lupus, Multiple Sclerosis.     Also many other serious conditions, like depression, Alzheimer's  Dementia, infertility, muscle aches, fatigue, fibromyalgia - just to name a few.  +++++++++++++++++++   Actinic Keratosis Actinic keratosis is a precancerous growth on the skin. This means it could develop into skin cancer if it is not treated. About 1% of actinic keratoses turn into skin cancer within a year. It is important to have all such growths removed to prevent them from developing into skin cancer. CAUSES  Actinic keratosis is caused by getting too much ultraviolet (UV) radiation from the sun or other UV light sources. RISK FACTORS Factors that increase your chances of getting actinic keratosis include:  Having light-colored skin and blue eyes.  Having blonde or red hair.  Spending a lot of time in the sun.  Age. The risk of actinic keratosis increases with age. SYMPTOMS  Actinic keratosis growths look like scaly, rough spots of skin. They can be as small as a pinhead or as big as a quarter. They may  itch, hurt, or feel sensitive. Sometimes there is a little tag of pink or gray skin growing off them. In some cases, actinic keratoses are easier felt than seen. They do not go away with the use of moisturizing lotions or creams. Actinic keratoses appear most often on areas of skin that get a lot of sun exposure. These areas include the:  Scalp.  Face.  Ears.  Lips.  Upper back.  Backs of the hands.  Forearms. DIAGNOSIS  Your health care provider can usually tell what is wrong by performing a physical exam. A tissue sample (biopsy) may also be taken and examined under a microscope. TREATMENT  Actinic keratosis can be treated several ways. Most treatments can be done in your health care provider's office. Treatment options may include:  Curettage. A tool is used to gently scrape off the growth.  Cryosurgery. Liquid nitrogen is applied to the growth to freeze it. The growth eventually falls off the skin.  Medicated creams, such as 5-fluorouracil or imiquimod. The medicine destroys the cells in the growth.  Chemical peels. Chemicals are applied to the growth and the outer layers of skin are peeled off.  Photodynamic therapy. A drug that makes your skin more sensitive to light is applied to the skin. A strong, blue light is aimed at the skin and destroys the growth. PREVENTION  To prevent future sun damage:  Try to avoid the sun between 10:00 a.m. and  4:00 p.m. when it is the strongest.  Use a sunscreen or sunblock with SPF 30 or greater.  Apply sunscreen at least 30 minutes before exposure to the sun.  Always wear protective hats, clothing, and sunglasses with UV protection.  Avoid medicines, herbs, and foods that increase your sensitivity to sunlight.  Avoid tanning beds. HOME CARE INSTRUCTIONS   If your skin was covered with a bandage, change and remove the bandage as directed by your health care provider.  Keep the treated area dry as directed by your health care  provider.  Apply any creams as prescribed by your health care provider. Follow the directions carefully.  Check your skin regularly for any changes.  Visit a skin doctor (dermatologist) every year for a skin exam. SEEK MEDICAL CARE IF:   Your skin does not heal and becomes irritated, red, or bleeds.  You notice any changes or new growths on your skin. Document Released: 01/11/2009 Document Revised: 03/01/2014 Document Reviewed: 11/26/2011 [facility] Patient Information 2015 [place], [place]. This information is not intended to replace advice given to you by your health care provider. Make sure you discuss any questions you have with your health care provider.

## 2015-06-11 LAB — INSULIN, FASTING: Insulin fasting, serum: 6.4 u[IU]/mL (ref 2.0–19.6)

## 2015-07-09 ENCOUNTER — Other Ambulatory Visit: Payer: Self-pay | Admitting: Internal Medicine

## 2015-07-09 DIAGNOSIS — E039 Hypothyroidism, unspecified: Secondary | ICD-10-CM

## 2015-08-07 ENCOUNTER — Other Ambulatory Visit: Payer: Self-pay | Admitting: Internal Medicine

## 2015-08-09 ENCOUNTER — Encounter: Payer: Self-pay | Admitting: Internal Medicine

## 2015-08-09 ENCOUNTER — Ambulatory Visit (INDEPENDENT_AMBULATORY_CARE_PROVIDER_SITE_OTHER): Payer: 59 | Admitting: Internal Medicine

## 2015-08-09 VITALS — BP 136/72 | HR 60 | Temp 97.5°F | Resp 16 | Ht 74.0 in | Wt 204.2 lb

## 2015-08-09 DIAGNOSIS — Z23 Encounter for immunization: Secondary | ICD-10-CM | POA: Diagnosis not present

## 2015-08-09 DIAGNOSIS — Z79899 Other long term (current) drug therapy: Secondary | ICD-10-CM

## 2015-08-09 DIAGNOSIS — I447 Left bundle-branch block, unspecified: Secondary | ICD-10-CM

## 2015-08-09 DIAGNOSIS — Z1212 Encounter for screening for malignant neoplasm of rectum: Secondary | ICD-10-CM

## 2015-08-09 DIAGNOSIS — I10 Essential (primary) hypertension: Secondary | ICD-10-CM | POA: Diagnosis not present

## 2015-08-09 DIAGNOSIS — Z6827 Body mass index (BMI) 27.0-27.9, adult: Secondary | ICD-10-CM | POA: Insufficient documentation

## 2015-08-09 DIAGNOSIS — Z111 Encounter for screening for respiratory tuberculosis: Secondary | ICD-10-CM

## 2015-08-09 DIAGNOSIS — R7303 Prediabetes: Secondary | ICD-10-CM

## 2015-08-09 DIAGNOSIS — Z0000 Encounter for general adult medical examination without abnormal findings: Secondary | ICD-10-CM

## 2015-08-09 DIAGNOSIS — Z0001 Encounter for general adult medical examination with abnormal findings: Secondary | ICD-10-CM

## 2015-08-09 DIAGNOSIS — E559 Vitamin D deficiency, unspecified: Secondary | ICD-10-CM

## 2015-08-09 DIAGNOSIS — Z125 Encounter for screening for malignant neoplasm of prostate: Secondary | ICD-10-CM

## 2015-08-09 DIAGNOSIS — E039 Hypothyroidism, unspecified: Secondary | ICD-10-CM

## 2015-08-09 DIAGNOSIS — R5383 Other fatigue: Secondary | ICD-10-CM

## 2015-08-09 DIAGNOSIS — E782 Mixed hyperlipidemia: Secondary | ICD-10-CM

## 2015-08-09 DIAGNOSIS — Z6826 Body mass index (BMI) 26.0-26.9, adult: Secondary | ICD-10-CM

## 2015-08-09 LAB — BASIC METABOLIC PANEL WITH GFR
BUN: 10 mg/dL (ref 7–25)
CALCIUM: 9.3 mg/dL (ref 8.6–10.3)
CO2: 29 mmol/L (ref 20–31)
CREATININE: 0.91 mg/dL (ref 0.70–1.33)
Chloride: 100 mmol/L (ref 98–110)
GFR, Est Non African American: >89 mL/min (ref >=60)
Glucose, Bld: 76 mg/dL (ref 65–99)
Potassium: 4.3 mmol/L (ref 3.5–5.3)
Sodium: 135 mmol/L (ref 135–146)

## 2015-08-09 LAB — TSH: TSH: 1.419 u[IU]/mL (ref 0.350–4.500)

## 2015-08-09 LAB — LIPID PANEL
Cholesterol: 153 mg/dL (ref 125–200)
HDL: 47 mg/dL (ref >=40)
LDL Cholesterol: 78 mg/dL (ref <130)
TRIGLYCERIDES: 138 mg/dL (ref <150)
Total CHOL/HDL Ratio: 3.3 Ratio (ref <=5.0)
VLDL: 28 mg/dL (ref <30)

## 2015-08-09 LAB — HEPATIC FUNCTION PANEL
ALBUMIN: 4.6 g/dL (ref 3.6–5.1)
ALT: 24 U/L (ref 9–46)
AST: 19 U/L (ref 10–35)
Alkaline Phosphatase: 88 U/L (ref 40–115)
BILIRUBIN TOTAL: 0.6 mg/dL (ref 0.2–1.2)
Bilirubin, Direct: 0.1 mg/dL (ref <=0.2)
Indirect Bilirubin: 0.5 mg/dL (ref 0.2–1.2)
Total Protein: 6.8 g/dL (ref 6.1–8.1)

## 2015-08-09 LAB — CBC WITH DIFFERENTIAL/PLATELET
BASOS ABS: 0.1 10*3/uL (ref 0.0–0.1)
Basophils Relative: 1 % (ref 0–1)
EOS ABS: 0.1 10*3/uL (ref 0.0–0.7)
EOS PCT: 2 % (ref 0–5)
HCT: 44.6 % (ref 39.0–52.0)
Hemoglobin: 15.5 g/dL (ref 13.0–17.0)
LYMPHS ABS: 2.6 10*3/uL (ref 0.7–4.0)
Lymphocytes Relative: 40 % (ref 12–46)
MCH: 30.2 pg (ref 26.0–34.0)
MCHC: 34.8 g/dL (ref 30.0–36.0)
MCV: 86.8 fL (ref 78.0–100.0)
MONOS PCT: 8 % (ref 3–12)
MPV: 9.8 fL (ref 8.6–12.4)
Monocytes Absolute: 0.5 10*3/uL (ref 0.1–1.0)
Neutro Abs: 3.2 10*3/uL (ref 1.7–7.7)
Neutrophils Relative %: 49 % (ref 43–77)
PLATELETS: 235 10*3/uL (ref 150–400)
RBC: 5.14 MIL/uL (ref 4.22–5.81)
RDW: 13.3 % (ref 11.5–15.5)
WBC: 6.6 10*3/uL (ref 4.0–10.5)

## 2015-08-09 LAB — IRON AND TIBC
%SAT: 38 % (ref 15–60)
Iron: 123 ug/dL (ref 50–180)
TIBC: 323 ug/dL (ref 250–425)
UIBC: 200 ug/dL (ref 125–400)

## 2015-08-09 LAB — VITAMIN B12: VITAMIN B 12: 621 pg/mL (ref 211–911)

## 2015-08-09 LAB — MAGNESIUM: MAGNESIUM: 2.1 mg/dL (ref 1.5–2.5)
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
## 2015-08-09 NOTE — Patient Instructions (Signed)
Recommend Adult Low Dose Aspirin or   coated  Aspirin 81 mg daily   To reduce risk of Colon Cancer 20 %,   Skin Cancer 26 % ,   Melanoma 46%   and   Pancreatic cancer 60%   ++++++++++++++++++++++++++++++++++++++++++++++++++++++  Vitamin D goal   is between 70-100.   Please make sure that you are taking your Vitamin D as directed.   It is very important as a natural anti-inflammatory   helping hair, skin, and nails, as well as reducing stroke and heart attack risk.   It helps your bones and helps with mood.  It also decreases numerous cancer risks so please take it as directed.   Low Vit D is associated with a 200-300% higher risk for CANCER   and 200-300% higher risk for HEART   ATTACK  &  STROKE.   ......................................  It is also associated with higher death rate at younger ages,   autoimmune diseases like Rheumatoid arthritis, Lupus, Multiple Sclerosis.     Also many other serious conditions, like depression, Alzheimer's  Dementia, infertility, muscle aches, fatigue, fibromyalgia - just to name a few.  ++++++++++++++++++++++++++++++++++++++++++++++++  Recommend the book "The END of DIETING" by Dr Joel Fuhrman   & the book "The END of DIABETES " by Dr Joel Fuhrman  At [employer] - get book & Audio CD's     Being diabetic has a  300% increased risk for heart attack, stroke, cancer, and alzheimer- type vascular dementia. It is very important that you work harder with diet by avoiding all foods that are white. Avoid white rice (brown & wild rice is OK), white potatoes (sweetpotatoes in moderation is OK), White bread or wheat bread or anything made out of white flour like bagels, donuts, rolls, buns, biscuits, cakes, pastries, cookies, pizza crust, and pasta (made from white flour & egg whites) - vegetarian pasta or spinach or wheat pasta is OK. Multigrain breads like Arnold's or [employer], or multigrain sandwich thins or flatbreads.  Diet,  exercise and weight loss can reverse and cure diabetes in the early stages.  Diet, exercise and weight loss is very important in the control and prevention of complications of diabetes which affects every system in your body, ie. Brain - dementia/stroke, eyes - glaucoma/blindness, heart - heart attack/heart failure, kidneys - dialysis, stomach - gastric paralysis, intestines - malabsorption, nerves - severe painful neuritis, circulation - gangrene & loss of a leg(s), and finally cancer and Alzheimers.    I recommend avoid fried & greasy foods,  sweets/candy, white rice (brown or wild rice or Quinoa is OK), white potatoes (sweet potatoes are OK) - anything made from white flour - bagels, doughnuts, rolls, buns, biscuits,white and wheat breads, pizza crust and traditional pasta made of white flour & egg white(vegetarian pasta or spinach or wheat pasta is OK).  Multi-grain bread is OK - like multi-grain flat bread or sandwich thins. Avoid alcohol in excess. Exercise is also important.    Eat all the vegetables you want - avoid meat, especially red meat and dairy - especially cheese.  Cheese is the most concentrated form of trans-fats which is the worst thing to clog up our arteries. Veggie cheese is OK which can be found in the fresh produce section at [employer] or [employer] or [employer]  ++++++++++++++++++++++++++++++++++++++++++++++++++ DASH Eating Plan  DASH stands for "Dietary Approaches to Stop Hypertension."   The DASH eating plan is a healthy eating plan that has been shown to reduce high   blood pressure (hypertension). Additional health benefits may include reducing the risk of type 2 diabetes mellitus, heart disease, and stroke. The DASH eating plan may also help with weight loss.  WHAT DO I NEED TO KNOW ABOUT THE DASH EATING PLAN? For the DASH eating plan, you will follow these general guidelines:  Choose foods with a percent daily value for sodium of less than 5% (as listed on the food  label).  Use salt-free seasonings or herbs instead of table salt or sea salt.  Check with your health care provider or pharmacist before using salt substitutes.  Eat lower-sodium products, often labeled as "lower sodium" or "no salt added."  Eat fresh foods.  Eat more vegetables, fruits, and low-fat dairy products.    Choose whole grains. Look for the word "whole" as the first word in the ingredient list.  Choose fish   Limit sweets, desserts, sugars, and sugary drinks.  Choose heart-healthy fats.  Eat veggie cheese   Eat more home-cooked food and less restaurant, buffet, and fast food.  Limit fried foods.  Cook foods using methods other than frying.  Limit canned vegetables. If you do use them, rinse them well to decrease the sodium.  When eating at a restaurant, ask that your food be prepared with less salt, or no salt if possible.                      WHAT FOODS CAN I EAT?  Seek help from a dietitian for individual calorie needs. Grains Whole grain or whole wheat bread. Brown rice. Whole grain or whole wheat pasta. Quinoa, bulgur, and whole grain cereals. Low-sodium cereals. Corn or whole wheat flour tortillas. Whole grain cornbread. Whole grain crackers. Low-sodium crackers.  Vegetables Fresh or frozen vegetables (raw, steamed, roasted, or grilled). Low-sodium or reduced-sodium tomato and vegetable juices. Low-sodium or reduced-sodium tomato sauce and paste. Low-sodium or reduced-sodium canned vegetables.   Fruits All fresh, canned (in natural juice), or frozen fruits.  Meat and Other Protein Products  All fish and seafood.  Dried beans, peas, or lentils. Unsalted nuts and seeds. Unsalted canned beans. Dairy Low-fat dairy products, such as skim or 1% milk, 2% or reduced-fat cheeses, low-fat ricotta or cottage cheese, or plain low-fat yogurt. Low-sodium or reduced-sodium cheeses.  Fats and Oils Tub margarines without trans fats. Light or reduced-fat mayonnaise  and salad dressings (reduced sodium). Avocado. Safflower, olive, or canola oils. Natural peanut or almond butter.  Other Unsalted popcorn and pretzels. The items listed above may not be a complete list of recommended foods or beverages. Contact your dietitian for more options.  +++++++++++++++++++++++++++++++++++++++++++  WHAT FOODS ARE NOT RECOMMENDED?  Grains/ White flour or wheat flour  White bread. White pasta. White rice. Refined cornbread. Bagels and croissants. Crackers that contain trans fat.  Vegetables  Creamed or fried vegetables. Vegetables in a . Regular canned vegetables. Regular canned tomato sauce and paste. Regular tomato and vegetable juices.  Fruits Dried fruits. Canned fruit in light or heavy syrup. Fruit juice.  Meat and Other Protein Products Meat in general. Fatty cuts of meat. Ribs, chicken wings, bacon, sausage, bologna, salami, chitterlings, fatback, hot dogs, bratwurst, and packaged luncheon meats. Salted nuts and seeds. Canned beans with salt.  Dairy Whole or 2% milk, cream, half-and-half, and cream cheese. Whole-fat or sweetened yogurt. Full-fat cheeses or blue cheese. Nondairy creamers and whipped toppings. Processed cheese, cheese spreads, or cheese curds.  Condiments Onion and garlic salt, seasoned salt, table salt, and sea   salt. Canned and packaged gravies. Worcestershire sauce. Tartar sauce. Barbecue sauce. Teriyaki sauce. Soy sauce, including reduced sodium. Steak sauce. Fish sauce. Oyster sauce. Cocktail sauce. Horseradish. Ketchup and mustard. Meat flavorings and tenderizers. Bouillon cubes. Hot sauce. Tabasco sauce. Marinades. Taco seasonings. Relishes.  Fats and Oils Butter, stick margarine, lard, shortening, ghee, and bacon fat. Coconut, palm kernel, or palm oils. Regular salad dressings.  Pickles and olives. Salted popcorn and pretzels. The items listed above may not be a complete list of foods and beverages to avoid.   Preventive Care for  Adults  A healthy lifestyle and preventive care can promote health and wellness. Preventive health guidelines for men include the following key practices:  A routine yearly physical is a good way to check with your health care provider about your health and preventative screening. It is a chance to share any concerns and updates on your health and to receive a thorough exam.  Visit your dentist for a routine exam and preventative care every 6 months. Brush your teeth twice a day and floss once a day. Good oral hygiene prevents tooth decay and gum disease.  The frequency of eye exams is based on your age, health, family medical history, use of contact lenses, and other factors. Follow your health care provider's recommendations for frequency of eye exams.  Eat a healthy diet. Foods such as vegetables, fruits, whole grains, low-fat dairy products, and lean protein foods contain the nutrients you need without too many calories. Decrease your intake of foods high in solid fats, added sugars, and salt. Eat the right amount of calories for you.Get information about a proper diet from your health care provider, if necessary.  Regular physical exercise is one of the most important things you can do for your health. Most adults should get at least 150 minutes of moderate-intensity exercise (any activity that increases your heart rate and causes you to sweat) each week. In addition, most adults need muscle-strengthening exercises on 2 or more days a week.  Maintain a healthy weight. The body mass index (BMI) is a screening tool to identify possible weight problems. It provides an estimate of body fat based on height and weight. Your health care provider can find your BMI and can help you achieve or maintain a healthy weight.For adults 20 years and older:  A BMI below 18.5 is considered underweight.  A BMI of 18.5 to 24.9 is normal.  A BMI of 25 to 29.9 is considered overweight.  A BMI of 30 and above  is considered obese.  Maintain normal blood lipids and cholesterol levels by exercising and minimizing your intake of saturated fat. Eat a balanced diet with plenty of fruit and vegetables. Blood tests for lipids and cholesterol should begin at age 20 and be repeated every 5 years. If your lipid or cholesterol levels are high, you are over 50, or you are at high risk for heart disease, you may need your cholesterol levels checked more frequently.Ongoing high lipid and cholesterol levels should be treated with medicines if diet and exercise are not working.  If you smoke, find out from your health care provider how to quit. If you do not use tobacco, do not start.  Lung cancer screening is recommended for adults aged 55-80 years who are at high risk for developing lung cancer because of a history of smoking. A yearly low-dose CT scan of the lungs is recommended for people who have at least a 30-pack-year history of smoking and are a   current smoker or have quit within the past 15 years. A pack year of smoking is smoking an average of 1 pack of cigarettes a day for 1 year (for example: 1 pack a day for 30 years or 2 packs a day for 15 years). Yearly screening should continue until the smoker has stopped smoking for at least 15 years. Yearly screening should be stopped for people who develop a health problem that would prevent them from having lung cancer treatment.  If you choose to drink alcohol, do not have more than 2 drinks per day. One drink is considered to be 12 ounces (355 mL) of beer, 5 ounces (148 mL) of wine, or 1.5 ounces (44 mL) of liquor.  Avoid use of street drugs. Do not share needles with anyone. Ask for help if you need support or instructions about stopping the use of drugs.  High blood pressure causes heart disease and increases the risk of stroke. Your blood pressure should be checked at least every 1-2 years. Ongoing high blood pressure should be treated with medicines, if weight loss  and exercise are not effective.  If you are 45-79 years old, ask your health care provider if you should take aspirin to prevent heart disease.  Diabetes screening involves taking a blood sample to check your fasting blood sugar level. This should be done once every 3 years, after age 45, if you are within normal weight and without risk factors for diabetes. Testing should be considered at a younger age or be carried out more frequently if you are overweight and have at least 1 risk factor for diabetes.  Colorectal cancer can be detected and often prevented. Most routine colorectal cancer screening begins at the age of 50 and continues through age 75. However, your health care provider may recommend screening at an earlier age if you have risk factors for colon cancer. On a yearly basis, your health care provider may provide home test kits to check for hidden blood in the stool. Use of a small camera at the end of a tube to directly examine the colon (sigmoidoscopy or colonoscopy) can detect the earliest forms of colorectal cancer. Talk to your health care provider about this at age 50, when routine screening begins. Direct exam of the colon should be repeated every 5-10 years through age 75, unless early forms of precancerous polyps or small growths are found.   Talk with your health care provider about prostate cancer screening.  Testicular cancer screening isrecommended for adult males. Screening includes self-exam, a health care provider exam, and other screening tests. Consult with your health care provider about any symptoms you have or any concerns you have about testicular cancer.  Use sunscreen. Apply sunscreen liberally and repeatedly throughout the day. You should seek shade when your shadow is shorter than you. Protect yourself by wearing long sleeves, pants, a wide-brimmed hat, and sunglasses year round, whenever you are outdoors.  Once a month, do a whole-body skin exam, using a mirror to  look at the skin on your back. Tell your health care provider about new moles, moles that have irregular borders, moles that are larger than a pencil eraser, or moles that have changed in shape or color.  Stay current with required vaccines (immunizations).  Influenza vaccine. All adults should be immunized every year.  Tetanus, diphtheria, and acellular pertussis (Td, Tdap) vaccine. An adult who has not previously received Tdap or who does not know his vaccine status should receive 1 dose of   Tdap. This initial dose should be followed by tetanus and diphtheria toxoids (Td) booster doses every 10 years. Adults with an unknown or incomplete history of completing a 3-dose immunization series with Td-containing vaccines should begin or complete a primary immunization series including a Tdap dose. Adults should receive a Td booster every 10 years.  Varicella vaccine. An adult without evidence of immunity to varicella should receive 2 doses or a second dose if he has previously received 1 dose.  Human papillomavirus (HPV) vaccine. Males aged 13-21 years who have not received the vaccine previously should receive the 3-dose series. Males aged 22-26 years may be immunized. Immunization is recommended through the age of 26 years for any male who has sex with males and did not get any or all doses earlier. Immunization is recommended for any person with an immunocompromised condition through the age of 26 years if he did not get any or all doses earlier. During the 3-dose series, the second dose should be obtained 4-8 weeks after the first dose. The third dose should be obtained 24 weeks after the first dose and 16 weeks after the second dose.  Zoster vaccine. One dose is recommended for adults aged 60 years or older unless certain conditions are present.    PREVNAR  - Pneumococcal 13-valent conjugate (PCV13) vaccine. When indicated, a person who is uncertain of his immunization history and has no record of  immunization should receive the PCV13 vaccine. An adult aged 19 years or older who has certain medical conditions and has not been previously immunized should receive 1 dose of PCV13 vaccine. This PCV13 should be followed with a dose of pneumococcal polysaccharide (PPSV23) vaccine. The PPSV23 vaccine dose should be obtained at least 8 weeks after the dose of PCV13 vaccine. An adult aged 19 years or older who has certain medical conditions and previously received 1 or more doses of PPSV23 vaccine should receive 1 dose of PCV13. The PCV13 vaccine dose should be obtained 1 or more years after the last PPSV23 vaccine dose.    PNEUMOVAX - Pneumococcal polysaccharide (PPSV23) vaccine. When PCV13 is also indicated, PCV13 should be obtained first. All adults aged 65 years and older should be immunized. An adult younger than age 65 years who has certain medical conditions should be immunized. Any person who resides in a nursing home or long-term care facility should be immunized. An adult smoker should be immunized. People with an immunocompromised condition and certain other conditions should receive both PCV13 and PPSV23 vaccines. People with human immunodeficiency virus (HIV) infection should be immunized as soon as possible after diagnosis. Immunization during chemotherapy or radiation therapy should be avoided. Routine use of PPSV23 vaccine is not recommended for American Indians, [place] Natives, or people younger than 65 years unless there are medical conditions that require PPSV23 vaccine. When indicated, people who have unknown immunization and have no record of immunization should receive PPSV23 vaccine. One-time revaccination 5 years after the first dose of PPSV23 is recommended for people aged 19-64 years who have chronic kidney failure, nephrotic syndrome, asplenia, or immunocompromised conditions. People who received 1-2 doses of PPSV23 before age 65 years should receive another dose of PPSV23 vaccine at age  65 years or later if at least 5 years have passed since the previous dose. Doses of PPSV23 are not needed for people immunized with PPSV23 at or after age 65 years.    Hepatitis A vaccine. Adults who wish to be protected from this disease, have certain high-risk conditions,   work with hepatitis A-infected animals, work in hepatitis A research labs, or travel to or work in countries with a high rate of hepatitis A should be immunized. Adults who were previously unvaccinated and who anticipate close contact with an international adoptee during the first 60 days after arrival in the [place] from a country with a high rate of hepatitis A should be immunized.    Hepatitis B vaccine. Adults should be immunized if they wish to be protected from this disease, have certain high-risk conditions, may be exposed to blood or other infectious body fluids, are household contacts or sex partners of hepatitis B positive people, are clients or workers in certain care facilities, or travel to or work in countries with a high rate of hepatitis B.   Preventive Service / Frequency   Ages 40 to 64  Blood pressure check.  Lipid and cholesterol check  Lung cancer screening. / Every year if you are aged 55-80 years and have a 30-pack-year history of smoking and currently smoke or have quit within the past 15 years. Yearly screening is stopped once you have quit smoking for at least 15 years or develop a health problem that would prevent you from having lung cancer treatment.  Fecal occult blood test (FOBT) of stool. / Every year beginning at age 50 and continuing until age 75. You may not have to do this test if you get a colonoscopy every 10 years.  Flexible sigmoidoscopy** or colonoscopy.** / Every 5 years for a flexible sigmoidoscopy or every 10 years for a colonoscopy beginning at age 50 and continuing until age 75. Screening for abdominal aortic aneurysm (AAA)  by ultrasound is recommended for people who  have history of high blood pressure or who are current or former smokers. 

## 2015-08-10 LAB — URINALYSIS, ROUTINE W REFLEX MICROSCOPIC
Bilirubin Urine: NEGATIVE
Glucose, UA: NEGATIVE
HGB URINE DIPSTICK: NEGATIVE
KETONES UR: NEGATIVE
Leukocytes, UA: NEGATIVE
NITRITE: NEGATIVE
PH: 7 (ref 5.0–8.0)
Protein, ur: NEGATIVE
Specific Gravity, Urine: 1.005 (ref 1.001–1.035)

## 2015-08-10 LAB — TESTOSTERONE: Testosterone: 362 ng/dL (ref 300–890)

## 2015-08-10 LAB — HEMOGLOBIN A1C
Hgb A1c MFr Bld: 5.6 % (ref <5.7)
MEAN PLASMA GLUCOSE: 114 mg/dL (ref <117)

## 2015-08-10 LAB — MICROALBUMIN / CREATININE URINE RATIO
Creatinine, Urine: 30.2 mg/dL
MICROALB UR: 2.4 mg/dL — AB (ref <2.0)
Microalb Creat Ratio: 79.5 mg/g — ABNORMAL HIGH (ref 0.0–30.0)

## 2015-08-10 LAB — INSULIN, RANDOM: INSULIN: 4.3 u[IU]/mL (ref 2.0–19.6)

## 2015-08-10 LAB — PSA: PSA: 1.49 ng/mL (ref <=4.00)

## 2015-08-10 LAB — VITAMIN D 25 HYDROXY (VIT D DEFICIENCY, FRACTURES): VIT D 25 HYDROXY: 54 ng/mL (ref 30–100)

## 2015-08-15 LAB — TB SKIN TEST
Induration: 0 mm
TB Skin Test: NEGATIVE

## 2015-08-18 ENCOUNTER — Encounter: Payer: Self-pay | Admitting: Internal Medicine

## 2015-09-09 ENCOUNTER — Other Ambulatory Visit: Payer: Self-pay | Admitting: Internal Medicine

## 2015-10-09 ENCOUNTER — Other Ambulatory Visit: Payer: Self-pay | Admitting: Internal Medicine

## 2015-11-16 ENCOUNTER — Encounter: Payer: Self-pay | Admitting: Internal Medicine

## 2015-11-16 ENCOUNTER — Ambulatory Visit (INDEPENDENT_AMBULATORY_CARE_PROVIDER_SITE_OTHER): Payer: 59 | Admitting: Internal Medicine

## 2015-11-16 VITALS — BP 144/80 | HR 58 | Temp 98.2°F | Resp 18 | Ht 74.0 in | Wt 214.0 lb

## 2015-11-16 DIAGNOSIS — Z79899 Other long term (current) drug therapy: Secondary | ICD-10-CM

## 2015-11-16 DIAGNOSIS — E782 Mixed hyperlipidemia: Secondary | ICD-10-CM | POA: Diagnosis not present

## 2015-11-16 DIAGNOSIS — E039 Hypothyroidism, unspecified: Secondary | ICD-10-CM

## 2015-11-16 DIAGNOSIS — I10 Essential (primary) hypertension: Secondary | ICD-10-CM

## 2015-11-16 DIAGNOSIS — E559 Vitamin D deficiency, unspecified: Secondary | ICD-10-CM | POA: Diagnosis not present

## 2015-11-16 DIAGNOSIS — R7303 Prediabetes: Secondary | ICD-10-CM | POA: Diagnosis not present

## 2015-11-16 LAB — CBC WITH DIFFERENTIAL/PLATELET
BASOS ABS: 0.1 10*3/uL (ref 0.0–0.1)
Basophils Relative: 1 % (ref 0–1)
EOS PCT: 2 % (ref 0–5)
Eosinophils Absolute: 0.1 10*3/uL (ref 0.0–0.7)
HEMATOCRIT: 47 % (ref 39.0–52.0)
Hemoglobin: 16.2 g/dL (ref 13.0–17.0)
LYMPHS ABS: 2 10*3/uL (ref 0.7–4.0)
LYMPHS PCT: 40 % (ref 12–46)
MCH: 30.2 pg (ref 26.0–34.0)
MCHC: 34.5 g/dL (ref 30.0–36.0)
MCV: 87.5 fL (ref 78.0–100.0)
MONO ABS: 0.5 10*3/uL (ref 0.1–1.0)
MPV: 9.6 fL (ref 8.6–12.4)
Monocytes Relative: 9 % (ref 3–12)
Neutro Abs: 2.4 10*3/uL (ref 1.7–7.7)
Neutrophils Relative %: 48 % (ref 43–77)
Platelets: 233 10*3/uL (ref 150–400)
RBC: 5.37 MIL/uL (ref 4.22–5.81)
RDW: 13.3 % (ref 11.5–15.5)
WBC: 5.1 10*3/uL (ref 4.0–10.5)

## 2015-11-16 LAB — HEPATIC FUNCTION PANEL
ALBUMIN: 4.6 g/dL (ref 3.6–5.1)
ALK PHOS: 79 U/L (ref 40–115)
ALT: 38 U/L (ref 9–46)
AST: 21 U/L (ref 10–35)
Bilirubin, Direct: 0.1 mg/dL (ref <=0.2)
Indirect Bilirubin: 0.6 mg/dL (ref 0.2–1.2)
TOTAL PROTEIN: 7.1 g/dL (ref 6.1–8.1)
Total Bilirubin: 0.7 mg/dL (ref 0.2–1.2)

## 2015-11-16 LAB — BASIC METABOLIC PANEL WITH GFR
BUN: 14 mg/dL (ref 7–25)
CO2: 30 mmol/L (ref 20–31)
Calcium: 9.9 mg/dL (ref 8.6–10.3)
Chloride: 99 mmol/L (ref 98–110)
Creat: 0.99 mg/dL (ref 0.70–1.33)
GFR, Est Non African American: 84 mL/min (ref >=60)
GLUCOSE: 93 mg/dL (ref 65–99)
POTASSIUM: 4.4 mmol/L (ref 3.5–5.3)
Sodium: 137 mmol/L (ref 135–146)

## 2015-11-16 LAB — LIPID PANEL
Cholesterol: 190 mg/dL (ref 125–200)
HDL: 57 mg/dL (ref >=40)
LDL Cholesterol: 114 mg/dL (ref <130)
Total CHOL/HDL Ratio: 3.3 Ratio (ref <=5.0)
Triglycerides: 93 mg/dL (ref <150)
VLDL: 19 mg/dL (ref <30)

## 2015-11-16 LAB — TSH: TSH: 2.219 u[IU]/mL (ref 0.350–4.500)

## 2015-11-16 NOTE — Progress Notes (Signed)
Patient ID: Gerald [name], male   DOB: 1956-07-13, 60 y.o.   MRN: [id number]  Assessment and Plan:  Hypertension:  -Continue medication,  -monitor blood pressure at home.  -Continue DASH diet.   -Reminder to go to the ER if any CP, SOB, nausea, dizziness, severe HA, changes vision/speech, left arm numbness and tingling, and jaw pain.  Cholesterol: -Continue diet and exercise.  -Check cholesterol.   Pre-diabetes: -Continue diet and exercise.  -Check A1C  Vitamin D Def: -check level -continue medications.   Cold symptoms -flonase -benadryl -antihistamine  Weight has increased as have blood pressure.  Recommend increased weight loss and monitoring blood pressure at home.    Continue diet and meds as discussed. Further disposition pending results of labs.  HPI 60 y.o. male  presents for 3 month follow up with hypertension, hyperlipidemia, prediabetes and vitamin D.   His blood pressure has been controlled at home, today their BP is BP: (!) 144/80 mmHg.   He does workout. He denies chest pain, shortness of breath, dizziness.   He is on cholesterol medication and denies myalgias. His cholesterol is at goal. The cholesterol last visit was:   Lab Results  Component Value Date   CHOL 153 08/09/2015   HDL 47 08/09/2015   LDLCALC 78 08/09/2015   TRIG 138 08/09/2015   CHOLHDL 3.3 08/09/2015     He has been working on diet and exercise for prediabetes, and denies foot ulcerations, hyperglycemia, hypoglycemia , increased appetite, nausea, paresthesia of the feet, polydipsia, polyuria, visual disturbances, vomiting and weight loss. Last A1C in the office was:  Lab Results  Component Value Date   HGBA1C 5.6 08/09/2015    Patient is on Vitamin D supplement.  Lab Results  Component Value Date   VD25OH 28 08/09/2015     He reports that he has been taking tylenol sinus for a persistent cold which has been going on since around [place].  He reports that his left ear bothers him, nasal  congestion, and mild cough.      Current Medications:  Current Outpatient Prescriptions on File Prior to Visit  Medication Sig Dispense Refill  . aspirin 81 MG tablet Take 81 mg by mouth daily.    . Cholecalciferol (VITAMIN D PO) Take 5,000 Units by mouth daily.    . CRESTOR 20 MG tablet TAKE 1 TABLET EVERY DAY 30 tablet 3  . enalapril (VASOTEC) 20 MG tablet TAKE 1 TABLET BY MOUTH EVERY DAY FOR BLOOD PRESSURE 90 tablet 1  . levothyroxine (SYNTHROID, LEVOTHROID) 50 MCG tablet TAKE 1 TABLET BY MOUTH EVERY DAY 90 tablet 1  . Magnesium 250 MG TABS Take 250 mg by mouth daily.    . Multiple Vitamins-Minerals (MULTIVITAMIN PO) Take by mouth.     No current facility-administered medications on file prior to visit.    Medical History:  Past Medical History  Diagnosis Date  . Hypertension   . Hyperlipidemia   . Thyroid disease     HYPO  . Varicose veins   . Vitamin D deficiency   . Prediabetes   . LBBB (left bundle branch block)     Allergies:  Allergies  Allergen Reactions  . Simvastatin   . Iodine Rash     Review of Systems:  Review of Systems  Constitutional: Positive for malaise/fatigue. Negative for fever and chills.  HENT: Positive for congestion and ear pain. Negative for sore throat.   Respiratory: Positive for cough. Negative for sputum production, shortness of breath and  wheezing.   Cardiovascular: Negative for chest pain, palpitations and leg swelling.  Gastrointestinal: Negative for diarrhea, constipation, blood in stool and melena.  Genitourinary: Negative.   Neurological: Positive for dizziness. Negative for sensory change, loss of consciousness and headaches.  Psychiatric/Behavioral: Negative for depression. The patient is not nervous/anxious and does not have insomnia.     Family history- Review and unchanged  Social history- Review and unchanged  Physical Exam: BP 144/80 mmHg  Pulse 58  Temp(Src) 98.2 F (36.8 C) (Temporal)  Resp 18  Ht  (1.88  m)  Wt 214 lb (97.07 kg)  BMI 27.46 kg/m2 Wt Readings from Last 3 Encounters:  11/16/15 214 lb (97.07 kg)  08/09/15 204 lb 3.2 oz (92.625 kg)  06/10/15 207 lb (93.895 kg)    General Appearance: Well nourished well developed, in no apparent distress. Eyes: PERRLA, EOMs, conjunctiva no swelling or erythema ENT/Mouth: Ear canals normal without obstruction, swelling, erythma, discharge.  TMs normal bilaterally.  Oropharynx moist, clear, without exudate, or postoropharyngeal swelling. Neck: Supple, thyroid normal,no cervical adenopathy  Respiratory: Respiratory effort normal, Breath sounds clear A&P without rhonchi, wheeze, or rale.  No retractions, no accessory usage. Cardio: RRR with no MRGs. Brisk peripheral pulses without edema.  Abdomen: Soft, + BS,  Non tender, no guarding, rebound, hernias, masses. Musculoskeletal: Full ROM, 5/5 strength, Normal gait Skin: Warm, dry without rashes, lesions, ecchymosis.  Neuro: Awake and oriented X 3, Cranial nerves intact. Normal muscle tone, no cerebellar symptoms. Psych: Normal affect, Insight and Judgment appropriate.    Terri Piedra, PA-C 8:59 AM [facility]

## 2015-11-16 NOTE — Patient Instructions (Signed)
Please take flonase, nasacort, or rhinocort 2 sprays per nostril before bedtime nightly.  Please take claritin, zyrtec or allegra daily.  You can take benadryl daily as long as you don't have to work or be alert.

## 2015-11-17 LAB — HEMOGLOBIN A1C
HEMOGLOBIN A1C: 5.6 % (ref <5.7)
MEAN PLASMA GLUCOSE: 114 mg/dL (ref <117)

## 2016-02-12 ENCOUNTER — Other Ambulatory Visit: Payer: Self-pay | Admitting: Internal Medicine

## 2016-02-20 ENCOUNTER — Encounter: Payer: Self-pay | Admitting: Internal Medicine

## 2016-02-20 ENCOUNTER — Ambulatory Visit: Payer: Self-pay | Admitting: Internal Medicine

## 2016-02-21 NOTE — Progress Notes (Signed)
Patient ID: Gerald [name], male   DOB: 19-Jun-1956, 60 y.o.   MRN: [id number] [name]

## 2016-02-23 ENCOUNTER — Ambulatory Visit (INDEPENDENT_AMBULATORY_CARE_PROVIDER_SITE_OTHER): Payer: 59 | Admitting: Internal Medicine

## 2016-02-23 ENCOUNTER — Encounter: Payer: Self-pay | Admitting: Internal Medicine

## 2016-02-23 VITALS — BP 124/66 | HR 72 | Temp 98.0°F | Resp 18 | Ht 74.0 in | Wt 214.0 lb

## 2016-02-23 DIAGNOSIS — R7303 Prediabetes: Secondary | ICD-10-CM | POA: Diagnosis not present

## 2016-02-23 DIAGNOSIS — Z79899 Other long term (current) drug therapy: Secondary | ICD-10-CM

## 2016-02-23 DIAGNOSIS — E039 Hypothyroidism, unspecified: Secondary | ICD-10-CM

## 2016-02-23 DIAGNOSIS — N61 Mastitis without abscess: Secondary | ICD-10-CM

## 2016-02-23 DIAGNOSIS — I10 Essential (primary) hypertension: Secondary | ICD-10-CM

## 2016-02-23 DIAGNOSIS — E782 Mixed hyperlipidemia: Secondary | ICD-10-CM | POA: Diagnosis not present

## 2016-02-23 DIAGNOSIS — E559 Vitamin D deficiency, unspecified: Secondary | ICD-10-CM

## 2016-02-23 LAB — CBC WITH DIFFERENTIAL/PLATELET
Basophils Absolute: 64 {cells}/uL (ref 0–200)
Basophils Relative: 1 %
Eosinophils Absolute: 128 {cells}/uL (ref 15–500)
Eosinophils Relative: 2 %
HCT: 44.9 % (ref 38.5–50.0)
Hemoglobin: 15.4 g/dL (ref 13.2–17.1)
Lymphocytes Relative: 32 %
Lymphs Abs: 2048 {cells}/uL (ref 850–3900)
MCH: 29.8 pg (ref 27.0–33.0)
MCHC: 34.3 g/dL (ref 32.0–36.0)
MCV: 87 fL (ref 80.0–100.0)
MPV: 10.2 fL (ref 7.5–12.5)
Monocytes Absolute: 576 {cells}/uL (ref 200–950)
Monocytes Relative: 9 %
Neutro Abs: 3584 {cells}/uL (ref 1500–7800)
Neutrophils Relative %: 56 %
Platelets: 219 K/uL (ref 140–400)
RBC: 5.16 MIL/uL (ref 4.20–5.80)
RDW: 13.8 % (ref 11.0–15.0)
WBC: 6.4 K/uL (ref 3.8–10.8)

## 2016-02-23 MED ORDER — TRIAMCINOLONE ACETONIDE 0.1 % EX CREA: 1.0000 "application " | TOPICAL_CREAM | Freq: Three times a day (TID) | CUTANEOUS | Status: DC

## 2016-02-23 MED ORDER — DOXYCYCLINE HYCLATE 100 MG PO CAPS: 100.0000 mg | ORAL_CAPSULE | Freq: Two times a day (BID) | ORAL | Status: DC

## 2016-02-23 NOTE — Progress Notes (Signed)
Assessment and Plan:  Hypertension:  -Continue medication,  -monitor blood pressure at home.  -Continue DASH diet.   -Reminder to go to the ER if any CP, SOB, nausea, dizziness, severe HA, changes vision/speech, left arm numbness and tingling, and jaw pain.  Cholesterol: -Continue diet and exercise.  -Check cholesterol.   Pre-diabetes: -Continue diet and exercise.  -Check A1C  Vitamin D Def: -check level -continue medications.   Mastitis -doxycycline -kenalog -warm compresses -if no improvement consider ultrasound   Continue diet and meds as discussed. Further disposition pending results of labs.  HPI 60 y.o. male  presents for 3 month follow up with hypertension, hyperlipidemia, prediabetes and vitamin D.   His blood pressure has been controlled at home, today their BP is BP: 124/66 mmHg.   He does not workout. He denies chest pain, shortness of breath, dizziness.   He is on cholesterol medication and denies myalgias. His cholesterol is at goal. The cholesterol last visit was:   Lab Results  Component Value Date   CHOL 190 11/16/2015   HDL 57 11/16/2015   LDLCALC 114 11/16/2015   TRIG 93 11/16/2015   CHOLHDL 3.3 11/16/2015     He has been working on diet and exercise for prediabetes, and denies foot ulcerations, hyperglycemia, hypoglycemia , increased appetite, nausea, paresthesia of the feet, polydipsia, polyuria, visual disturbances, vomiting and weight loss. Last A1C in the office was:  Lab Results  Component Value Date   HGBA1C 5.6 11/16/2015    Patient is on Vitamin D supplement.  Lab Results  Component Value Date   VD25OH 54 08/09/2015     Patient reports that he has been having a lump on his nipple.  He noticed it 2 days ago.  He reports that the spot is very tender.  No drainage.  It has not been itching at all.  No other changes noted.     Current Medications:  Current Outpatient Prescriptions on File Prior to Visit  Medication Sig Dispense Refill   . aspirin 81 MG tablet Take 81 mg by mouth daily.    . Cholecalciferol (VITAMIN D PO) Take 5,000 Units by mouth daily.    . CRESTOR 20 MG tablet TAKE 1 TABLET EVERY DAY 30 tablet 3  . enalapril (VASOTEC) 20 MG tablet TAKE 1 TABLET BY MOUTH EVERY DAY FOR BLOOD PRESSURE 90 tablet 1  . levothyroxine (SYNTHROID, LEVOTHROID) 50 MCG tablet TAKE 1 TABLET BY MOUTH EVERY DAY 90 tablet 1  . Magnesium 250 MG TABS Take 250 mg by mouth daily.    . Multiple Vitamins-Minerals (MULTIVITAMIN PO) Take by mouth.     No current facility-administered medications on file prior to visit.    Medical History:  Past Medical History  Diagnosis Date  . Hypertension   . Hyperlipidemia   . Thyroid disease     HYPO  . Varicose veins   . Vitamin D deficiency   . Prediabetes   . LBBB (left bundle branch block)     Allergies:  Allergies  Allergen Reactions  . Simvastatin   . Iodine Rash     Review of Systems:  Review of Systems  Constitutional: Negative for fever, chills and malaise/fatigue.  HENT: Negative for congestion, ear pain and sore throat.   Respiratory: Negative for cough, shortness of breath and wheezing.   Cardiovascular: Negative for chest pain, palpitations and leg swelling.  Gastrointestinal: Negative for heartburn, abdominal pain, diarrhea, constipation, blood in stool and melena.  Genitourinary: Negative.   Skin:  Negative.   Neurological: Negative for dizziness, sensory change, loss of consciousness and headaches.  Psychiatric/Behavioral: Negative for depression. The patient is not nervous/anxious and does not have insomnia.     Family history- Review and unchanged  Social history- Review and unchanged  Physical Exam: BP 124/66 mmHg  Pulse 72  Temp(Src) 98 F (36.7 C) (Temporal)  Resp 18  Ht  (1.88 m)  Wt 214 lb (97.07 kg)  BMI 27.46 kg/m2 Wt Readings from Last 3 Encounters:  02/23/16 214 lb (97.07 kg)  11/16/15 214 lb (97.07 kg)  08/09/15 204 lb 3.2 oz (92.625 kg)     General Appearance: Well nourished well developed, in no apparent distress. Eyes: PERRLA, EOMs, conjunctiva no swelling or erythema ENT/Mouth: Ear canals normal without obstruction, swelling, erythma, discharge.  TMs normal bilaterally.  Oropharynx moist, clear, without exudate, or postoropharyngeal swelling. Neck: Supple, thyroid normal,no cervical adenopathy  Respiratory: Respiratory effort normal, Breath sounds clear A&P without rhonchi, wheeze, or rale.  No retractions, no accessory usage. Cardio: RRR with no MRGs. Brisk peripheral pulses without edema. Macular erythematous patch around the areola of the right nipple with mild tenderness to palpation.  No palpable abnormalities.   Abdomen: Soft, + BS,  Non tender, no guarding, rebound, hernias, masses. Musculoskeletal: Full ROM, 5/5 strength, Normal gait Skin: Warm, dry without rashes, lesions, ecchymosis.  Neuro: Awake and oriented X 3, Cranial nerves intact. Normal muscle tone, no cerebellar symptoms. Psych: Normal affect, Insight and Judgment appropriate.    Terri Piedra, PA-C 2:04 PM [facility]

## 2016-02-24 LAB — HEPATIC FUNCTION PANEL
ALT: 23 U/L (ref 9–46)
AST: 15 U/L (ref 10–35)
Albumin: 4.4 g/dL (ref 3.6–5.1)
Alkaline Phosphatase: 76 U/L (ref 40–115)
BILIRUBIN DIRECT: 0.2 mg/dL (ref <=0.2)
BILIRUBIN INDIRECT: 0.5 mg/dL (ref 0.2–1.2)
TOTAL PROTEIN: 6.9 g/dL (ref 6.1–8.1)
Total Bilirubin: 0.7 mg/dL (ref 0.2–1.2)

## 2016-02-24 LAB — HEMOGLOBIN A1C
Hgb A1c MFr Bld: 5.4 % (ref <5.7)
Mean Plasma Glucose: 108 mg/dL

## 2016-02-24 LAB — LIPID PANEL
Cholesterol: 160 mg/dL (ref 125–200)
HDL: 47 mg/dL (ref >=40)
LDL CALC: 81 mg/dL (ref <130)
TRIGLYCERIDES: 159 mg/dL — AB (ref <150)
Total CHOL/HDL Ratio: 3.4 Ratio (ref <=5.0)
VLDL: 32 mg/dL — ABNORMAL HIGH (ref <30)

## 2016-02-24 LAB — BASIC METABOLIC PANEL WITH GFR
BUN: 14 mg/dL (ref 7–25)
CALCIUM: 9.1 mg/dL (ref 8.6–10.3)
CO2: 23 mmol/L (ref 20–31)
Chloride: 102 mmol/L (ref 98–110)
Creat: 1.12 mg/dL (ref 0.70–1.33)
GFR, EST AFRICAN AMERICAN: 83 mL/min (ref >=60)
GFR, EST NON AFRICAN AMERICAN: 72 mL/min (ref >=60)
GLUCOSE: 111 mg/dL — AB (ref 65–99)
Potassium: 4 mmol/L (ref 3.5–5.3)
SODIUM: 139 mmol/L (ref 135–146)

## 2016-02-24 LAB — TSH: TSH: 1.45 mIU/L (ref 0.40–4.50)

## 2016-04-11 ENCOUNTER — Other Ambulatory Visit: Payer: Self-pay | Admitting: Internal Medicine

## 2016-04-19 ENCOUNTER — Other Ambulatory Visit: Payer: Self-pay | Admitting: Internal Medicine

## 2016-08-22 ENCOUNTER — Other Ambulatory Visit: Payer: Self-pay | Admitting: Internal Medicine

## 2016-09-15 ENCOUNTER — Other Ambulatory Visit: Payer: Self-pay | Admitting: Internal Medicine

## 2016-09-27 ENCOUNTER — Ambulatory Visit (INDEPENDENT_AMBULATORY_CARE_PROVIDER_SITE_OTHER): Payer: 59 | Admitting: Internal Medicine

## 2016-09-27 VITALS — BP 138/84 | HR 76 | Temp 97.5°F | Resp 16 | Ht 74.0 in | Wt 214.8 lb

## 2016-09-27 DIAGNOSIS — I10 Essential (primary) hypertension: Secondary | ICD-10-CM

## 2016-09-27 DIAGNOSIS — I447 Left bundle-branch block, unspecified: Secondary | ICD-10-CM

## 2016-09-27 DIAGNOSIS — Z79899 Other long term (current) drug therapy: Secondary | ICD-10-CM

## 2016-09-27 DIAGNOSIS — Z0001 Encounter for general adult medical examination with abnormal findings: Secondary | ICD-10-CM

## 2016-09-27 DIAGNOSIS — Z1212 Encounter for screening for malignant neoplasm of rectum: Secondary | ICD-10-CM

## 2016-09-27 DIAGNOSIS — Z0000 Encounter for general adult medical examination without abnormal findings: Secondary | ICD-10-CM

## 2016-09-27 DIAGNOSIS — Z125 Encounter for screening for malignant neoplasm of prostate: Secondary | ICD-10-CM

## 2016-09-27 DIAGNOSIS — R5383 Other fatigue: Secondary | ICD-10-CM

## 2016-09-27 DIAGNOSIS — Z136 Encounter for screening for cardiovascular disorders: Secondary | ICD-10-CM | POA: Diagnosis not present

## 2016-09-27 DIAGNOSIS — E782 Mixed hyperlipidemia: Secondary | ICD-10-CM

## 2016-09-27 DIAGNOSIS — E559 Vitamin D deficiency, unspecified: Secondary | ICD-10-CM

## 2016-09-27 DIAGNOSIS — R7303 Prediabetes: Secondary | ICD-10-CM

## 2016-09-27 DIAGNOSIS — Z111 Encounter for screening for respiratory tuberculosis: Secondary | ICD-10-CM

## 2016-09-27 LAB — MAGNESIUM: MAGNESIUM: 2 mg/dL (ref 1.5–2.5)

## 2016-09-27 LAB — CBC WITH DIFFERENTIAL/PLATELET
BASOS PCT: 0 %
Basophils Absolute: 0 cells/uL (ref 0–200)
EOS ABS: 116 {cells}/uL (ref 15–500)
Eosinophils Relative: 2 %
HCT: 47.4 % (ref 38.5–50.0)
Hemoglobin: 16.3 g/dL (ref 13.2–17.1)
LYMPHS PCT: 36 %
Lymphs Abs: 2088 cells/uL (ref 850–3900)
MCH: 30 pg (ref 27.0–33.0)
MCHC: 34.4 g/dL (ref 32.0–36.0)
MCV: 87.3 fL (ref 80.0–100.0)
MONOS PCT: 9 %
MPV: 9.9 fL (ref 7.5–12.5)
Monocytes Absolute: 522 cells/uL (ref 200–950)
Neutro Abs: 3074 cells/uL (ref 1500–7800)
Neutrophils Relative %: 53 %
PLATELETS: 254 10*3/uL (ref 140–400)
RBC: 5.43 MIL/uL (ref 4.20–5.80)
RDW: 13.9 % (ref 11.0–15.0)
WBC: 5.8 10*3/uL (ref 3.8–10.8)

## 2016-09-27 LAB — LIPID PANEL
Cholesterol: 235 mg/dL — ABNORMAL HIGH (ref <200)
HDL: 49 mg/dL (ref >40)
LDL CALC: 135 mg/dL — AB (ref <100)
TRIGLYCERIDES: 257 mg/dL — AB (ref <150)
Total CHOL/HDL Ratio: 4.8 Ratio (ref <5.0)
VLDL: 51 mg/dL — ABNORMAL HIGH (ref <30)

## 2016-09-27 LAB — BASIC METABOLIC PANEL WITH GFR
BUN: 18 mg/dL (ref 7–25)
CALCIUM: 10 mg/dL (ref 8.6–10.3)
CO2: 28 mmol/L (ref 20–31)
CREATININE: 1.02 mg/dL (ref 0.70–1.33)
Chloride: 100 mmol/L (ref 98–110)
GFR, Est Non African American: 81 mL/min (ref >=60)
GLUCOSE: 69 mg/dL (ref 65–99)
Potassium: 4.1 mmol/L (ref 3.5–5.3)
Sodium: 139 mmol/L (ref 135–146)

## 2016-09-27 LAB — IRON AND TIBC
%SAT: 42 % (ref 15–60)
IRON: 151 ug/dL (ref 50–180)
TIBC: 356 ug/dL (ref 250–425)
UIBC: 205 ug/dL (ref 125–400)

## 2016-09-27 LAB — HEPATIC FUNCTION PANEL
ALT: 32 U/L (ref 9–46)
AST: 23 U/L (ref 10–35)
Albumin: 4.7 g/dL (ref 3.6–5.1)
Alkaline Phosphatase: 87 U/L (ref 40–115)
Bilirubin, Direct: 0.1 mg/dL (ref <=0.2)
Indirect Bilirubin: 0.5 mg/dL (ref 0.2–1.2)
Total Bilirubin: 0.6 mg/dL (ref 0.2–1.2)
Total Protein: 7.4 g/dL (ref 6.1–8.1)

## 2016-09-27 NOTE — Patient Instructions (Signed)
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
## 2016-09-28 ENCOUNTER — Encounter: Payer: Self-pay | Admitting: Internal Medicine

## 2016-09-28 LAB — URINALYSIS, ROUTINE W REFLEX MICROSCOPIC
BILIRUBIN URINE: NEGATIVE
GLUCOSE, UA: NEGATIVE
Hgb urine dipstick: NEGATIVE
KETONES UR: NEGATIVE
Leukocytes, UA: NEGATIVE
NITRITE: NEGATIVE
PH: 7 (ref 5.0–8.0)
SPECIFIC GRAVITY, URINE: 1.017 (ref 1.001–1.035)

## 2016-09-28 LAB — URINALYSIS, MICROSCOPIC ONLY
BACTERIA UA: NONE SEEN [HPF]
CRYSTALS: NONE SEEN [HPF]
Casts: NONE SEEN [LPF]
RBC / HPF: NONE SEEN RBC/HPF (ref <=2)
SQUAMOUS EPITHELIAL / LPF: NONE SEEN [HPF] (ref <=5)
WBC UA: NONE SEEN WBC/HPF (ref <=5)
Yeast: NONE SEEN [HPF]

## 2016-09-28 LAB — HEMOGLOBIN A1C
Hgb A1c MFr Bld: 5.2 % (ref <5.7)
MEAN PLASMA GLUCOSE: 103 mg/dL

## 2016-09-28 LAB — MICROALBUMIN / CREATININE URINE RATIO
CREATININE, URINE: 97 mg/dL (ref 20–370)
Microalb Creat Ratio: 98 mcg/mg creat — ABNORMAL HIGH (ref <30)
Microalb, Ur: 9.5 mg/dL

## 2016-09-28 LAB — TSH: TSH: 1.32 mIU/L (ref 0.40–4.50)

## 2016-09-28 LAB — VITAMIN D 25 HYDROXY (VIT D DEFICIENCY, FRACTURES): VIT D 25 HYDROXY: 47 ng/mL (ref 30–100)

## 2016-09-28 LAB — TESTOSTERONE: TESTOSTERONE: 256 ng/dL (ref 250–827)

## 2016-09-28 LAB — PSA: PSA: 1.2 ng/mL (ref <=4.0)

## 2016-09-28 LAB — VITAMIN B12: Vitamin B-12: 511 pg/mL (ref 200–1100)

## 2016-09-28 LAB — INSULIN, RANDOM: INSULIN: 18.1 u[IU]/mL (ref 2.0–19.6)

## 2016-10-03 LAB — TB SKIN TEST
Induration: 0 mm
TB Skin Test: NEGATIVE

## 2016-10-20 ENCOUNTER — Other Ambulatory Visit: Payer: Self-pay | Admitting: Internal Medicine

## 2017-01-04 ENCOUNTER — Ambulatory Visit: Payer: Self-pay | Admitting: Internal Medicine

## 2017-01-09 ENCOUNTER — Ambulatory Visit (INDEPENDENT_AMBULATORY_CARE_PROVIDER_SITE_OTHER): Payer: 59 | Admitting: Internal Medicine

## 2017-01-09 ENCOUNTER — Encounter: Payer: Self-pay | Admitting: Internal Medicine

## 2017-01-09 VITALS — BP 142/84 | HR 64 | Temp 98.2°F | Resp 16 | Ht 74.0 in | Wt 216.0 lb

## 2017-01-09 DIAGNOSIS — E559 Vitamin D deficiency, unspecified: Secondary | ICD-10-CM | POA: Diagnosis not present

## 2017-01-09 DIAGNOSIS — E039 Hypothyroidism, unspecified: Secondary | ICD-10-CM | POA: Diagnosis not present

## 2017-01-09 DIAGNOSIS — Z79899 Other long term (current) drug therapy: Secondary | ICD-10-CM

## 2017-01-09 DIAGNOSIS — Z1211 Encounter for screening for malignant neoplasm of colon: Secondary | ICD-10-CM

## 2017-01-09 DIAGNOSIS — E782 Mixed hyperlipidemia: Secondary | ICD-10-CM

## 2017-01-09 DIAGNOSIS — I10 Essential (primary) hypertension: Secondary | ICD-10-CM | POA: Diagnosis not present

## 2017-01-09 LAB — HEPATIC FUNCTION PANEL
ALBUMIN: 4.6 g/dL (ref 3.6–5.1)
ALT: 33 U/L (ref 9–46)
AST: 21 U/L (ref 10–35)
Alkaline Phosphatase: 87 U/L (ref 40–115)
BILIRUBIN INDIRECT: 0.7 mg/dL (ref 0.2–1.2)
Bilirubin, Direct: 0.2 mg/dL (ref <=0.2)
TOTAL PROTEIN: 7.5 g/dL (ref 6.1–8.1)
Total Bilirubin: 0.9 mg/dL (ref 0.2–1.2)

## 2017-01-09 LAB — CBC WITH DIFFERENTIAL/PLATELET
Basophils Absolute: 56 cells/uL (ref 0–200)
Basophils Relative: 1 %
Eosinophils Absolute: 168 cells/uL (ref 15–500)
Eosinophils Relative: 3 %
HCT: 46.6 % (ref 38.5–50.0)
Hemoglobin: 16 g/dL (ref 13.2–17.1)
LYMPHS PCT: 38 %
Lymphs Abs: 2128 cells/uL (ref 850–3900)
MCH: 30.5 pg (ref 27.0–33.0)
MCHC: 34.3 g/dL (ref 32.0–36.0)
MCV: 88.9 fL (ref 80.0–100.0)
MONO ABS: 448 {cells}/uL (ref 200–950)
MPV: 9.7 fL (ref 7.5–12.5)
Monocytes Relative: 8 %
NEUTROS PCT: 50 %
Neutro Abs: 2800 cells/uL (ref 1500–7800)
Platelets: 231 10*3/uL (ref 140–400)
RBC: 5.24 MIL/uL (ref 4.20–5.80)
RDW: 14 % (ref 11.0–15.0)
WBC: 5.6 10*3/uL (ref 3.8–10.8)

## 2017-01-09 LAB — LIPID PANEL
Cholesterol: 211 mg/dL — ABNORMAL HIGH (ref <200)
HDL: 54 mg/dL (ref >40)
LDL Cholesterol: 134 mg/dL — ABNORMAL HIGH (ref <100)
TRIGLYCERIDES: 115 mg/dL (ref <150)
Total CHOL/HDL Ratio: 3.9 Ratio (ref <5.0)
VLDL: 23 mg/dL (ref <30)

## 2017-01-09 LAB — BASIC METABOLIC PANEL WITH GFR
BUN: 15 mg/dL (ref 7–25)
CALCIUM: 9.7 mg/dL (ref 8.6–10.3)
CO2: 30 mmol/L (ref 20–31)
CREATININE: 1.01 mg/dL (ref 0.70–1.33)
Chloride: 99 mmol/L (ref 98–110)
GFR, EST NON AFRICAN AMERICAN: 81 mL/min (ref >=60)
GLUCOSE: 94 mg/dL (ref 65–99)
Potassium: 4.5 mmol/L (ref 3.5–5.3)
Sodium: 136 mmol/L (ref 135–146)

## 2017-01-09 LAB — TSH: TSH: 1.65 mIU/L (ref 0.40–4.50)

## 2017-01-09 NOTE — Progress Notes (Signed)
Assessment and Plan:  Hypertension:  -BP  Borderline elevated today in office -recommended tight monitoring at home and to call office if consistently over 145/90 -Continue medication,  -monitor blood pressure at home.  -Continue DASH diet.   -Reminder to go to the ER if any CP, SOB, nausea, dizziness, severe HA, changes vision/speech, left arm numbness and tingling, and jaw pain.  Cholesterol: -cont crestor -at goal -Continue diet and exercise.  -Check cholesterol.   Pre-diabetes: -has history of prediabetes but is no longer prediabetic -last A1c level was 5.2 and he does not need to have monitoring more than 1-2 times per year -Continue diet and exercise.   Vitamin D Def: -continue medications.   Hypothyroidism -cont levothyroxine -TSH -dose adjust if necessary  Screening for colon cancer -set for colonoscopy at [facility] in April  Continue diet and meds as discussed. Further disposition pending results of labs.  HPI 61 y.o. male  presents for 3 month follow up with hypertension, hyperlipidemia, prediabetes and vitamin D.   His blood pressure has been controlled at home, today their BP is BP: (!) 142/84.   He does workout. He denies chest pain, shortness of breath, dizziness.  He had stopped walking due to weather.  He reports that he is going to start riding a bike as well.  He reports that he wants to start walking his dogs.     He is on cholesterol medication and denies myalgias. His cholesterol is at goal. The cholesterol last visit was:   Lab Results  Component Value Date   CHOL 235 (H) 09/27/2016   HDL 49 09/27/2016   LDLCALC 135 (H) 09/27/2016   TRIG 257 (H) 09/27/2016   CHOLHDL 4.8 09/27/2016     He has been working on diet and exercise for prediabetes, and denies foot ulcerations, hyperglycemia, hypoglycemia , increased appetite, nausea, paresthesia of the feet, polydipsia, polyuria, visual disturbances, vomiting and weight loss. Last A1C in the office was:  Lab  Results  Component Value Date   HGBA1C 5.2 09/27/2016    Patient is on Vitamin D supplement.  Lab Results  Component Value Date   VD25OH 47 09/27/2016     He reports that he has been taking his thyroid tablet.  He reports that he has been doing well with this.  He does have a colonoscopy and endoscopy scheduled in April at the [place].  This is set for April the 16th    Current Medications:  Current Outpatient Prescriptions on File Prior to Visit  Medication Sig Dispense Refill  . aspirin 81 MG tablet Take 81 mg by mouth daily.    . Cholecalciferol (VITAMIN D PO) Take 5,000 Units by mouth daily.    . enalapril (VASOTEC) 20 MG tablet TAKE 1 TABLET BY MOUTH EVERY DAY FOR BLOOD PRESSURE 90 tablet 1  . levothyroxine (SYNTHROID, LEVOTHROID) 50 MCG tablet TAKE 1 TABLET BY MOUTH EVERY DAY 90 tablet 1  . Magnesium 250 MG TABS Take 250 mg by mouth daily.    . Multiple Vitamins-Minerals (MULTIVITAMIN PO) Take by mouth.    . rosuvastatin (CRESTOR) 20 MG tablet TAKE 1 TABLET BY MOUTH EVERY DAY 90 tablet 1   No current facility-administered medications on file prior to visit.     Medical History:  Past Medical History:  Diagnosis Date  . Hyperlipidemia   . Hypertension   . LBBB (left bundle branch block)   . Prediabetes   . Thyroid disease    HYPO  . Varicose veins   .  Vitamin D deficiency     Allergies:  Allergies  Allergen Reactions  . Simvastatin   . Iodine Rash     Review of Systems:  Review of Systems  Constitutional: Negative for chills, fever and malaise/fatigue.  HENT: Negative for congestion, ear pain and sore throat.   Eyes: Negative.   Respiratory: Negative for cough, shortness of breath and wheezing.   Cardiovascular: Negative for chest pain, palpitations and leg swelling.  Gastrointestinal: Negative for abdominal pain, blood in stool, constipation, diarrhea, heartburn and melena.  Genitourinary: Negative.   Skin: Negative.   Neurological: Negative for dizziness,  sensory change, loss of consciousness and headaches.  Psychiatric/Behavioral: Negative for depression. The patient is not nervous/anxious and does not have insomnia.     Family history- Review and unchanged  Social history- Review and unchanged  Physical Exam: BP (!) 142/84   Pulse 64   Temp 98.2 F (36.8 C) (Temporal)   Resp 16   Ht 6\' 2"  (1.88 m)   Wt 216 lb (98 kg)   BMI 27.73 kg/m  Wt Readings from Last 3 Encounters:  01/09/17 216 lb (98 kg)  09/27/16 214 lb 12.8 oz (97.4 kg)  02/23/16 214 lb (97.1 kg)    General Appearance: Well nourished well developed, in no apparent distress. Eyes: PERRLA, EOMs, conjunctiva no swelling or erythema ENT/Mouth: Ear canals normal without obstruction, swelling, erythma, discharge.  TMs normal bilaterally.  Oropharynx moist, clear, without exudate, or postoropharyngeal swelling. Neck: Supple, thyroid normal,no cervical adenopathy  Respiratory: Respiratory effort normal, Breath sounds clear A&P without rhonchi, wheeze, or rale.  No retractions, no accessory usage. Cardio: RRR with no MRGs. Brisk peripheral pulses without edema.  Abdomen: Soft, + BS,  Non tender, no guarding, rebound, hernias, masses. Musculoskeletal: Full ROM, 5/5 strength, Normal gait Skin: Warm, dry without rashes, lesions, ecchymosis.  Neuro: Awake and oriented X 3, Cranial nerves intact. Normal muscle tone, no cerebellar symptoms. Psych: Normal affect, Insight and Judgment appropriate.    Terri Piedra, PA-C 9:35 AM [facility]

## 2017-02-11 DIAGNOSIS — Z86010 Personal history of colonic polyps: Secondary | ICD-10-CM | POA: Diagnosis not present

## 2017-02-11 DIAGNOSIS — K317 Polyp of stomach and duodenum: Secondary | ICD-10-CM | POA: Diagnosis not present

## 2017-02-11 DIAGNOSIS — Z1211 Encounter for screening for malignant neoplasm of colon: Secondary | ICD-10-CM | POA: Diagnosis not present

## 2017-02-24 ENCOUNTER — Other Ambulatory Visit: Payer: Self-pay | Admitting: Internal Medicine

## 2017-03-22 ENCOUNTER — Other Ambulatory Visit: Payer: Self-pay | Admitting: Internal Medicine

## 2017-04-07 NOTE — Patient Instructions (Signed)
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
## 2017-04-08 ENCOUNTER — Encounter: Payer: Self-pay | Admitting: Internal Medicine

## 2017-04-08 ENCOUNTER — Ambulatory Visit (INDEPENDENT_AMBULATORY_CARE_PROVIDER_SITE_OTHER): Payer: 59 | Admitting: Internal Medicine

## 2017-04-08 VITALS — BP 136/86 | HR 61 | Temp 97.9°F | Resp 13 | Ht 74.0 in | Wt 214.8 lb

## 2017-04-08 DIAGNOSIS — E559 Vitamin D deficiency, unspecified: Secondary | ICD-10-CM

## 2017-04-08 DIAGNOSIS — E039 Hypothyroidism, unspecified: Secondary | ICD-10-CM | POA: Diagnosis not present

## 2017-04-08 DIAGNOSIS — E782 Mixed hyperlipidemia: Secondary | ICD-10-CM | POA: Diagnosis not present

## 2017-04-08 DIAGNOSIS — R7303 Prediabetes: Secondary | ICD-10-CM

## 2017-04-08 DIAGNOSIS — I10 Essential (primary) hypertension: Secondary | ICD-10-CM | POA: Diagnosis not present

## 2017-04-08 DIAGNOSIS — Z79899 Other long term (current) drug therapy: Secondary | ICD-10-CM

## 2017-04-08 LAB — CBC WITH DIFFERENTIAL/PLATELET
Basophils Absolute: 64 cells/uL (ref 0–200)
Basophils Relative: 1 %
EOS PCT: 2 %
Eosinophils Absolute: 128 cells/uL (ref 15–500)
HCT: 45.5 % (ref 38.5–50.0)
HEMOGLOBIN: 15 g/dL (ref 13.2–17.1)
Lymphocytes Relative: 41 %
Lymphs Abs: 2624 cells/uL (ref 850–3900)
MCH: 29.1 pg (ref 27.0–33.0)
MCHC: 33 g/dL (ref 32.0–36.0)
MCV: 88.3 fL (ref 80.0–100.0)
MONOS PCT: 9 %
MPV: 10 fL (ref 7.5–12.5)
Monocytes Absolute: 576 cells/uL (ref 200–950)
NEUTROS PCT: 47 %
Neutro Abs: 3008 cells/uL (ref 1500–7800)
PLATELETS: 244 10*3/uL (ref 140–400)
RBC: 5.15 MIL/uL (ref 4.20–5.80)
RDW: 13.8 % (ref 11.0–15.0)
WBC: 6.4 10*3/uL (ref 3.8–10.8)

## 2017-04-09 LAB — HEMOGLOBIN A1C
HEMOGLOBIN A1C: 5.2 % (ref <5.7)
MEAN PLASMA GLUCOSE: 103 mg/dL

## 2017-04-09 LAB — LIPID PANEL
Cholesterol: 188 mg/dL (ref <200)
HDL: 51 mg/dL (ref >40)
LDL CALC: 92 mg/dL (ref <100)
Total CHOL/HDL Ratio: 3.7 Ratio (ref <5.0)
Triglycerides: 223 mg/dL — ABNORMAL HIGH (ref <150)
VLDL: 45 mg/dL — ABNORMAL HIGH (ref <30)

## 2017-04-09 LAB — BASIC METABOLIC PANEL WITH GFR
BUN: 18 mg/dL (ref 7–25)
CALCIUM: 9.4 mg/dL (ref 8.6–10.3)
CO2: 26 mmol/L (ref 20–31)
Chloride: 101 mmol/L (ref 98–110)
Creat: 1.13 mg/dL (ref 0.70–1.33)
GFR, Est African American: 82 mL/min (ref >=60)
GFR, Est Non African American: 71 mL/min (ref >=60)
GLUCOSE: 72 mg/dL (ref 65–99)
Potassium: 4.6 mmol/L (ref 3.5–5.3)
Sodium: 137 mmol/L (ref 135–146)

## 2017-04-09 LAB — HEPATIC FUNCTION PANEL
ALT: 27 U/L (ref 9–46)
AST: 17 U/L (ref 10–35)
Albumin: 4.7 g/dL (ref 3.6–5.1)
Alkaline Phosphatase: 89 U/L (ref 40–115)
BILIRUBIN DIRECT: 0.1 mg/dL (ref <=0.2)
BILIRUBIN TOTAL: 0.6 mg/dL (ref 0.2–1.2)
Indirect Bilirubin: 0.5 mg/dL (ref 0.2–1.2)
Total Protein: 7.2 g/dL (ref 6.1–8.1)

## 2017-04-09 LAB — VITAMIN D 25 HYDROXY (VIT D DEFICIENCY, FRACTURES): Vit D, 25-Hydroxy: 53 ng/mL (ref 30–100)

## 2017-04-09 LAB — TSH: TSH: 1.65 m[IU]/L (ref 0.40–4.50)

## 2017-04-09 LAB — INSULIN, RANDOM: INSULIN: 8.6 u[IU]/mL (ref 2.0–19.6)

## 2017-04-09 LAB — MAGNESIUM: Magnesium: 2.3 mg/dL (ref 1.5–2.5)

## 2017-04-23 ENCOUNTER — Other Ambulatory Visit: Payer: Self-pay | Admitting: Internal Medicine

## 2017-07-22 ENCOUNTER — Encounter: Payer: Self-pay | Admitting: Internal Medicine

## 2017-07-22 ENCOUNTER — Ambulatory Visit (INDEPENDENT_AMBULATORY_CARE_PROVIDER_SITE_OTHER): Payer: 59 | Admitting: Internal Medicine

## 2017-07-22 VITALS — BP 132/70 | HR 64 | Temp 97.7°F | Resp 18 | Ht 74.0 in | Wt 207.5 lb

## 2017-07-22 DIAGNOSIS — E559 Vitamin D deficiency, unspecified: Secondary | ICD-10-CM

## 2017-07-22 DIAGNOSIS — Z23 Encounter for immunization: Secondary | ICD-10-CM | POA: Diagnosis not present

## 2017-07-22 DIAGNOSIS — E782 Mixed hyperlipidemia: Secondary | ICD-10-CM | POA: Diagnosis not present

## 2017-07-22 DIAGNOSIS — Z79899 Other long term (current) drug therapy: Secondary | ICD-10-CM

## 2017-07-22 DIAGNOSIS — R7303 Prediabetes: Secondary | ICD-10-CM

## 2017-07-22 DIAGNOSIS — E039 Hypothyroidism, unspecified: Secondary | ICD-10-CM | POA: Diagnosis not present

## 2017-07-22 DIAGNOSIS — I10 Essential (primary) hypertension: Secondary | ICD-10-CM | POA: Diagnosis not present
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
## 2017-07-22 NOTE — Patient Instructions (Signed)

## 2017-07-23 LAB — HEPATIC FUNCTION PANEL
AG Ratio: 2 (calc) (ref 1.0–2.5)
ALBUMIN MSPROF: 4.5 g/dL (ref 3.6–5.1)
ALT: 23 U/L (ref 9–46)
AST: 19 U/L (ref 10–35)
Alkaline phosphatase (APISO): 87 U/L (ref 40–115)
Bilirubin, Direct: 0.2 mg/dL (ref 0.0–0.2)
GLOBULIN: 2.3 g/dL (ref 1.9–3.7)
Indirect Bilirubin: 0.4 mg/dL (calc) (ref 0.2–1.2)
TOTAL PROTEIN: 6.8 g/dL (ref 6.1–8.1)
Total Bilirubin: 0.6 mg/dL (ref 0.2–1.2)

## 2017-07-23 LAB — CBC WITH DIFFERENTIAL/PLATELET
Basophils Absolute: 30 cells/uL (ref 0–200)
Basophils Relative: 0.5 %
Eosinophils Absolute: 162 cells/uL (ref 15–500)
Eosinophils Relative: 2.7 %
HCT: 43 % (ref 38.5–50.0)
Hemoglobin: 15.1 g/dL (ref 13.2–17.1)
Lymphs Abs: 2142 cells/uL (ref 850–3900)
MCH: 30.1 pg (ref 27.0–33.0)
MCHC: 35.1 g/dL (ref 32.0–36.0)
MCV: 85.7 fL (ref 80.0–100.0)
MONOS PCT: 8.7 %
MPV: 10.7 fL (ref 7.5–12.5)
NEUTROS ABS: 3144 {cells}/uL (ref 1500–7800)
Neutrophils Relative %: 52.4 %
PLATELETS: 231 10*3/uL (ref 140–400)
RBC: 5.02 10*6/uL (ref 4.20–5.80)
RDW: 12.9 % (ref 11.0–15.0)
TOTAL LYMPHOCYTE: 35.7 %
WBC: 6 10*3/uL (ref 3.8–10.8)
WBCMIX: 522 {cells}/uL (ref 200–950)

## 2017-07-23 LAB — BASIC METABOLIC PANEL WITH GFR
BUN: 15 mg/dL (ref 7–25)
CALCIUM: 9.5 mg/dL (ref 8.6–10.3)
CHLORIDE: 102 mmol/L (ref 98–110)
CO2: 29 mmol/L (ref 20–32)
Creat: 0.96 mg/dL (ref 0.70–1.33)
GFR, Est African American: 100 mL/min/{1.73_m2} (ref >=60)
GFR, Est Non African American: 86 mL/min/{1.73_m2} (ref >=60)
GLUCOSE: 88 mg/dL (ref 65–99)
Potassium: 3.9 mmol/L (ref 3.5–5.3)
Sodium: 139 mmol/L (ref 135–146)

## 2017-07-23 LAB — LIPID PANEL
CHOL/HDL RATIO: 3.1 (calc) (ref <5.0)
Cholesterol: 157 mg/dL (ref <200)
HDL: 51 mg/dL (ref >40)
LDL Cholesterol (Calc): 80 mg/dL (calc)
NON-HDL CHOLESTEROL (CALC): 106 mg/dL (ref <130)
Triglycerides: 157 mg/dL — ABNORMAL HIGH (ref <150)

## 2017-07-23 LAB — HEMOGLOBIN A1C
Hgb A1c MFr Bld: 5.1 % of total Hgb (ref <5.7)
Mean Plasma Glucose: 100 (calc)
eAG (mmol/L): 5.5 (calc)

## 2017-07-23 LAB — VITAMIN D 25 HYDROXY (VIT D DEFICIENCY, FRACTURES): Vit D, 25-Hydroxy: 63 ng/mL (ref 30–100)

## 2017-07-23 LAB — INSULIN, RANDOM: Insulin: 2.2 u[IU]/mL (ref 2.0–19.6)

## 2017-07-23 LAB — TSH: TSH: 0.77 m[IU]/L (ref 0.40–4.50)

## 2017-07-23 LAB — MAGNESIUM: MAGNESIUM: 2.2 mg/dL (ref 1.5–2.5)

## 2017-07-24 ENCOUNTER — Ambulatory Visit: Payer: Self-pay | Admitting: Physician Assistant

## 2017-08-26 ENCOUNTER — Other Ambulatory Visit: Payer: Self-pay | Admitting: Internal Medicine

## 2017-10-09 ENCOUNTER — Other Ambulatory Visit: Payer: Self-pay | Admitting: Internal Medicine

## 2017-10-27 ENCOUNTER — Encounter: Payer: Self-pay | Admitting: Internal Medicine
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
## 2017-10-28 ENCOUNTER — Encounter: Payer: Self-pay | Admitting: Internal Medicine

## 2017-10-28 ENCOUNTER — Ambulatory Visit: Payer: 59 | Admitting: Internal Medicine

## 2017-10-28 VITALS — BP 126/84 | HR 56 | Temp 97.7°F | Resp 18 | Ht 74.0 in | Wt 207.4 lb

## 2017-10-28 DIAGNOSIS — Z0001 Encounter for general adult medical examination with abnormal findings: Secondary | ICD-10-CM

## 2017-10-28 DIAGNOSIS — Z111 Encounter for screening for respiratory tuberculosis: Secondary | ICD-10-CM

## 2017-10-28 DIAGNOSIS — Z136 Encounter for screening for cardiovascular disorders: Secondary | ICD-10-CM

## 2017-10-28 DIAGNOSIS — Z1211 Encounter for screening for malignant neoplasm of colon: Secondary | ICD-10-CM

## 2017-10-28 DIAGNOSIS — I447 Left bundle-branch block, unspecified: Secondary | ICD-10-CM

## 2017-10-28 DIAGNOSIS — E782 Mixed hyperlipidemia: Secondary | ICD-10-CM

## 2017-10-28 DIAGNOSIS — Z0000 Encounter for general adult medical examination without abnormal findings: Secondary | ICD-10-CM | POA: Diagnosis not present

## 2017-10-28 DIAGNOSIS — Z8639 Personal history of other endocrine, nutritional and metabolic disease: Secondary | ICD-10-CM

## 2017-10-28 DIAGNOSIS — R7309 Other abnormal glucose: Secondary | ICD-10-CM

## 2017-10-28 DIAGNOSIS — E559 Vitamin D deficiency, unspecified: Secondary | ICD-10-CM

## 2017-10-28 DIAGNOSIS — I10 Essential (primary) hypertension: Secondary | ICD-10-CM

## 2017-10-28 DIAGNOSIS — G250 Essential tremor: Secondary | ICD-10-CM

## 2017-10-28 DIAGNOSIS — Z79899 Other long term (current) drug therapy: Secondary | ICD-10-CM

## 2017-10-28 DIAGNOSIS — E039 Hypothyroidism, unspecified: Secondary | ICD-10-CM

## 2017-10-28 DIAGNOSIS — R5383 Other fatigue: Secondary | ICD-10-CM

## 2017-10-28 DIAGNOSIS — Z125 Encounter for screening for malignant neoplasm of prostate: Secondary | ICD-10-CM

## 2017-10-28 DIAGNOSIS — Z1212 Encounter for screening for malignant neoplasm of rectum: Secondary | ICD-10-CM

## 2017-10-28 MED ORDER — DIAZEPAM 5 MG PO TABS: ORAL_TABLET | ORAL | 0 refills | Status: AC

## 2017-10-28 MED ORDER — PROPRANOLOL HCL ER 120 MG PO CP24: ORAL_CAPSULE | ORAL | 3 refills | Status: DC

## 2017-10-28 NOTE — Patient Instructions (Signed)

## 2017-10-30 LAB — CBC WITH DIFFERENTIAL/PLATELET
Basophils Absolute: 38 cells/uL (ref 0–200)
Basophils Relative: 0.4 %
EOS ABS: 143 {cells}/uL (ref 15–500)
Eosinophils Relative: 1.5 %
HEMATOCRIT: 47 % (ref 38.5–50.0)
Hemoglobin: 16.4 g/dL (ref 13.2–17.1)
LYMPHS ABS: 2052 {cells}/uL (ref 850–3900)
MCH: 30 pg (ref 27.0–33.0)
MCHC: 34.9 g/dL (ref 32.0–36.0)
MCV: 86.1 fL (ref 80.0–100.0)
MPV: 10 fL (ref 7.5–12.5)
Monocytes Relative: 9.6 %
NEUTROS PCT: 66.9 %
Neutro Abs: 6356 cells/uL (ref 1500–7800)
PLATELETS: 223 10*3/uL (ref 140–400)
RBC: 5.46 10*6/uL (ref 4.20–5.80)
RDW: 13 % (ref 11.0–15.0)
TOTAL LYMPHOCYTE: 21.6 %
WBC: 9.5 10*3/uL (ref 3.8–10.8)
WBCMIX: 912 {cells}/uL (ref 200–950)

## 2017-10-30 LAB — TSH: TSH: 1.16 m[IU]/L (ref 0.40–4.50)

## 2017-10-30 LAB — URINALYSIS, ROUTINE W REFLEX MICROSCOPIC
BILIRUBIN URINE: NEGATIVE
Glucose, UA: NEGATIVE
HGB URINE DIPSTICK: NEGATIVE
KETONES UR: NEGATIVE
Leukocytes, UA: NEGATIVE
NITRITE: NEGATIVE
PROTEIN: NEGATIVE
SPECIFIC GRAVITY, URINE: 1.008 (ref 1.001–1.03)
pH: 5.5 (ref 5.0–8.0)

## 2017-10-30 LAB — LIPID PANEL
CHOLESTEROL: 224 mg/dL — AB (ref <200)
HDL: 46 mg/dL (ref >40)
LDL Cholesterol (Calc): 140 mg/dL (calc) — ABNORMAL HIGH
Non-HDL Cholesterol (Calc): 178 mg/dL (calc) — ABNORMAL HIGH (ref <130)
Total CHOL/HDL Ratio: 4.9 (calc) (ref <5.0)
Triglycerides: 235 mg/dL — ABNORMAL HIGH (ref <150)

## 2017-10-30 LAB — BASIC METABOLIC PANEL WITH GFR
BUN: 18 mg/dL (ref 7–25)
CALCIUM: 9.5 mg/dL (ref 8.6–10.3)
CHLORIDE: 100 mmol/L (ref 98–110)
CO2: 30 mmol/L (ref 20–32)
Creat: 1.06 mg/dL (ref 0.70–1.33)
GFR, EST AFRICAN AMERICAN: 89 mL/min/{1.73_m2} (ref >=60)
GFR, Est Non African American: 76 mL/min/{1.73_m2} (ref >=60)
GLUCOSE: 82 mg/dL (ref 65–99)
POTASSIUM: 4.3 mmol/L (ref 3.5–5.3)
Sodium: 137 mmol/L (ref 135–146)

## 2017-10-30 LAB — HEPATIC FUNCTION PANEL
AG Ratio: 1.7 (calc) (ref 1.0–2.5)
ALBUMIN MSPROF: 4.5 g/dL (ref 3.6–5.1)
ALT: 26 U/L (ref 9–46)
AST: 21 U/L (ref 10–35)
Alkaline phosphatase (APISO): 93 U/L (ref 40–115)
BILIRUBIN DIRECT: 0.1 mg/dL (ref 0.0–0.2)
BILIRUBIN INDIRECT: 0.6 mg/dL (ref 0.2–1.2)
Globulin: 2.6 g/dL (calc) (ref 1.9–3.7)
TOTAL PROTEIN: 7.1 g/dL (ref 6.1–8.1)
Total Bilirubin: 0.7 mg/dL (ref 0.2–1.2)

## 2017-10-30 LAB — HEMOGLOBIN A1C
EAG (MMOL/L): 6 (calc)
HEMOGLOBIN A1C: 5.4 %{Hb} (ref <5.7)
Mean Plasma Glucose: 108 (calc)

## 2017-10-30 LAB — MAGNESIUM: MAGNESIUM: 1.9 mg/dL (ref 1.5–2.5)

## 2017-10-30 LAB — IRON, TOTAL/TOTAL IRON BINDING CAP
%SAT: 38 % (calc) (ref 15–60)
Iron: 133 ug/dL (ref 50–180)
TIBC: 348 ug/dL (ref 250–425)

## 2017-10-30 LAB — PSA: PSA: 1.4 ng/mL (ref <=4.0)

## 2017-10-30 LAB — INSULIN, RANDOM: INSULIN: 3.8 u[IU]/mL (ref 2.0–19.6)

## 2017-10-30 LAB — MICROALBUMIN / CREATININE URINE RATIO
CREATININE, URINE: 39 mg/dL (ref 20–320)
MICROALB UR: 4.1 mg/dL
Microalb Creat Ratio: 105 mcg/mg creat — ABNORMAL HIGH (ref <30)

## 2017-10-30 LAB — TB SKIN TEST
Induration: 0 mm
TB Skin Test: NEGATIVE

## 2017-10-30 LAB — VITAMIN B12: Vitamin B-12: 531 pg/mL (ref 200–1100)

## 2017-10-30 LAB — VITAMIN D 25 HYDROXY (VIT D DEFICIENCY, FRACTURES): VIT D 25 HYDROXY: 48 ng/mL (ref 30–100)

## 2017-10-30 LAB — TESTOSTERONE: TESTOSTERONE: 324 ng/dL (ref 250–827)

## 2018-02-27 ENCOUNTER — Other Ambulatory Visit: Payer: Self-pay | Admitting: Internal Medicine

## 2018-03-12 ENCOUNTER — Other Ambulatory Visit: Payer: Self-pay | Admitting: Internal Medicine

## 2018-03-12 DIAGNOSIS — I10 Essential (primary) hypertension: Secondary | ICD-10-CM

## 2018-03-12 DIAGNOSIS — G250 Essential tremor: Secondary | ICD-10-CM

## 2018-05-02 ENCOUNTER — Other Ambulatory Visit: Payer: Self-pay | Admitting: Internal Medicine

## 2018-05-02 ENCOUNTER — Other Ambulatory Visit: Payer: Self-pay | Admitting: Physician Assistant

## 2018-05-31 ENCOUNTER — Other Ambulatory Visit: Payer: Self-pay | Admitting: Internal Medicine

## 2018-06-12 ENCOUNTER — Ambulatory Visit: Payer: Self-pay | Admitting: Physician Assistant

## 2018-06-16 ENCOUNTER — Ambulatory Visit: Payer: Self-pay | Admitting: Physician Assistant

## 2018-06-22 NOTE — Progress Notes (Signed)
Assessment and Plan:  Hypertension:  -Continue medication,  -monitor blood pressure at home.  -Continue DASH diet.   -Reminder to go to the ER if any CP, SOB, nausea, dizziness, severe HA, changes vision/speech, left arm numbness and tingling, and jaw pain.  Cholesterol: -cont crestor -at goal -Continue diet and exercise.  -Check cholesterol.   Pre-diabetes: -Continue diet and exercise.   Vitamin D Def: -continue medications.   Hypothyroidism -cont levothyroxine -TSH -dose adjust if necessary   Continue diet and meds as discussed. Further disposition pending results of labs. Future Appointments  Date Time Provider Department Center  06/23/2018  3:30 PM Quentin Mulling, PA-C [facility] None  11/21/2018 10:00 AM [name], MD [facility] None  02/05/2019  2:30 PM Judd Gaudier, NP [facility] None  05/14/2019  2:30 PM [name], MD [facility] None    HPI 62 y.o. male  presents for 3 month follow up with hypertension, hyperlipidemia, prediabetes and vitamin D.  Has 11 grandkids, 11th due in Nov little girl.    His blood pressure has been controlled at home, he increase his enalapril to 1 whole pill and he is on propanolol ER for tremor, today their BP is BP: 118/76.   He does workout. He denies chest pain, shortness of breath, dizziness.  He had stopped walking due to weather.     He is on cholesterol medication and denies myalgias. His cholesterol is at goal. The cholesterol last visit was:   Lab Results  Component Value Date   CHOL 224 (H) 10/28/2017   HDL 46 10/28/2017   LDLCALC 140 (H) 10/28/2017   TRIG 235 (H) 10/28/2017   CHOLHDL 4.9 10/28/2017     He has been working on diet and exercise for prediabetes, and denies foot ulcerations, hyperglycemia, hypoglycemia , increased appetite, nausea, paresthesia of the feet, polydipsia, polyuria, visual disturbances, vomiting and weight loss. Last A1C in the office was:  Lab Results  Component Value Date   HGBA1C 5.4 10/28/2017    Patient is on Vitamin D supplement.  Lab Results  Component Value Date   VD25OH 48 10/28/2017     He is on thyroid medication. His medication was not changed last visit.   Lab Results  Component Value Date   TSH 1.16 10/28/2017  .  BMI is Body mass index is 27.6 kg/m., he is working on diet and exercise. Wt Readings from Last 3 Encounters:  06/23/18 215 lb (97.5 kg)  10/28/17 207 lb 6.4 oz (94.1 kg)  07/22/17 207 lb 8 oz (94.1 kg)     Current Medications:  Current Outpatient Medications on File Prior to Visit  Medication Sig Dispense Refill  . aspirin 81 MG tablet Take 81 mg by mouth daily.    . Cholecalciferol (VITAMIN D PO) Take 5,000 Units by mouth daily.    . enalapril (VASOTEC) 20 MG tablet TAKE 1 TABLET BY MOUTH EVERY DAY FOR BLOOD PRESSURE 30 tablet 1  . levothyroxine (SYNTHROID, LEVOTHROID) 50 MCG tablet TAKE 1 TABLET BY MOUTH EVERY DAY 30 tablet 0  . Magnesium 250 MG TABS Take 250 mg by mouth daily.    . Multiple Vitamins-Minerals (MULTIVITAMIN PO) Take by mouth.    . propranolol ER (INDERAL LA) 120 MG 24 hr capsule TAKE 1 CAPSULE DAILY FOR FOR BLOOD PRESSURE AND TREMOR 30 capsule 3  . rosuvastatin (CRESTOR) 20 MG tablet TAKE 1 TABLET BY MOUTH EVERY DAY 90 tablet 1   No current facility-administered medications on file prior to visit.  Medical History:  Past Medical History:  Diagnosis Date  . Hyperlipidemia   . Hypertension   . LBBB (left bundle branch block)   . Prediabetes   . Thyroid disease    HYPO  . Varicose veins   . Vitamin D deficiency     Allergies:  Allergies  Allergen Reactions  . Simvastatin   . Iodine Rash     Review of Systems:  Review of Systems  Constitutional: Negative for chills, fever and malaise/fatigue.  HENT: Negative for congestion, ear pain and sore throat.   Eyes: Negative.   Respiratory: Negative for cough, shortness of breath and wheezing.   Cardiovascular: Negative for chest pain,  palpitations and leg swelling.  Gastrointestinal: Negative for abdominal pain, blood in stool, constipation, diarrhea, heartburn and melena.  Genitourinary: Negative.   Skin: Negative.   Neurological: Negative for dizziness, sensory change, loss of consciousness and headaches.  Psychiatric/Behavioral: Negative for depression. The patient is not nervous/anxious and does not have insomnia.     Family history- Review and unchanged  Social history- Review and unchanged  Physical Exam: BP 118/76   Pulse (!) 58   Temp 98.2 F (36.8 C)   Resp 14   Ht 6\' 2"  (1.88 m)   Wt 215 lb (97.5 kg)   SpO2 97%   BMI 27.60 kg/m  Wt Readings from Last 3 Encounters:  06/23/18 215 lb (97.5 kg)  10/28/17 207 lb 6.4 oz (94.1 kg)  07/22/17 207 lb 8 oz (94.1 kg)    General Appearance: Well nourished well developed, in no apparent distress. Eyes: PERRLA, EOMs, conjunctiva no swelling or erythema ENT/Mouth: Ear canals normal without obstruction, swelling, erythma, discharge.  TMs normal bilaterally.  Oropharynx moist, clear, without exudate, or postoropharyngeal swelling. Neck: Supple, thyroid normal,no cervical adenopathy  Respiratory: Respiratory effort normal, Breath sounds clear A&P without rhonchi, wheeze, or rale.  No retractions, no accessory usage. Cardio: RRR with no MRGs. Brisk peripheral pulses without edema.  Abdomen: Soft, + BS,  Non tender, no guarding, rebound, hernias, masses. Musculoskeletal: Full ROM, 5/5 strength, Normal gait Skin: Warm, dry without rashes, lesions, ecchymosis.  Neuro: Awake and oriented X 3, Cranial nerves intact. Normal muscle tone, no cerebellar symptoms. Intention tremor and slight head tremor.  Psych: Normal affect, Insight and Judgment appropriate.    Quentin Mulling, PA-C 3:11 PM [facility] Adolescent [facility]

## 2018-06-23 ENCOUNTER — Ambulatory Visit (INDEPENDENT_AMBULATORY_CARE_PROVIDER_SITE_OTHER): Payer: 59 | Admitting: Physician Assistant

## 2018-06-23 ENCOUNTER — Encounter: Payer: Self-pay | Admitting: Physician Assistant

## 2018-06-23 VITALS — BP 118/76 | HR 58 | Temp 98.2°F | Resp 14 | Ht 74.0 in | Wt 215.0 lb

## 2018-06-23 DIAGNOSIS — Z79899 Other long term (current) drug therapy: Secondary | ICD-10-CM | POA: Diagnosis not present

## 2018-06-23 DIAGNOSIS — E782 Mixed hyperlipidemia: Secondary | ICD-10-CM | POA: Diagnosis not present

## 2018-06-23 DIAGNOSIS — E039 Hypothyroidism, unspecified: Secondary | ICD-10-CM | POA: Diagnosis not present

## 2018-06-23 DIAGNOSIS — R251 Tremor, unspecified: Secondary | ICD-10-CM

## 2018-06-23 DIAGNOSIS — E559 Vitamin D deficiency, unspecified: Secondary | ICD-10-CM

## 2018-06-23 DIAGNOSIS — I10 Essential (primary) hypertension: Secondary | ICD-10-CM

## 2018-06-23 NOTE — Patient Instructions (Signed)
Essential Tremor A tremor is trembling or shaking that you cannot control. Most tremors affect the hands or arms. Tremors can also affect the head, vocal cords, face, and other parts of the body. Essential tremor is a tremor without a known cause. What are the causes? Essential tremor has no known cause. What increases the risk? You may be at greater risk of essential tremor if:  You have a family member with essential tremor.  You are age 60 or older.  You take certain medicines.  What are the signs or symptoms? The main sign of a tremor is uncontrolled and unintentional rhythmic shaking of a body part.  You may have difficulty eating with a spoon or fork.  You may have difficulty writing.  You may nod your head up and down or side to side.  You may have a quivering voice.  Your tremors:  May get worse over time.  May come and go.  May be more noticeable on one side of your body.  May get worse due to stress, fatigue, caffeine, and extreme heat or cold.  How is this diagnosed? Your health care provider can diagnose essential tremor based on your symptoms, medical history, and a physical examination. There is no single test to diagnose an essential tremor. However, your health care provider may perform a variety of tests to rule out other conditions. Tests may include:  Blood and urine tests.  Imaging studies of your brain, such as: ? CT scan. ? MRI.  A test that measures involuntary muscle movement (electromyogram).  How is this treated? Your tremors may go away without treatment. Mild tremors may not need treatment if they do not affect your day-to-day life. Severe tremors may need to be treated using one or a combination of the following options:  Medicines. This may include medicine that is injected.  Lifestyle changes.  Physical therapy.  Follow these instructions at home:  Take medicines only as directed by your health care provider.  Limit alcohol  intake to no more than 1 drink per day for nonpregnant women and 2 drinks per day for men. One drink equals 12 oz of beer, 5 oz of wine, or 1 oz of hard liquor.  Do not use any tobacco products, including cigarettes, chewing tobacco, or electronic cigarettes. If you need help quitting, ask your health care provider.  Take medicines only as directed by your health care provider.  Avoid extreme heat or cold.  Limit the amount of caffeine you consumeas directed by your health care provider.  Try to get eight hours of sleep each night.  Find ways to manage your stress, such as meditation or yoga.  Keep all follow-up visits as directed by your health care provider. This is important. This includes any physical therapy visits. Contact a health care provider if:  You experience any changes in the location or intensity of your tremors.  You start having a tremor after starting a new medicine.  You have tremor with other symptoms such as: ? Numbness. ? Tingling. ? Pain. ? Weakness.  Your tremor gets worse.  Your tremor interferes with your daily life. This information is not intended to replace advice given to you by your health care provider. Make sure you discuss any questions you have with your health care provider. Document Released: 11/05/2014 Document Revised: 03/22/2016 Document Reviewed: 04/12/2014 Elsevier Interactive Patient Education  2018 [employer].  Compensation Strategies for Tremors  When eating, try the following  Eat out of bowls,  divided plates, or use a plate guard (available at a medical supply store) and eat with a spoon so that you have an edge to scoop up food.  Try raising your plate so that there is less distance between the plate and mouth.Try stabilizing elbows on the tables or against your body.  Use utensil with built-up/larger grips as they are easier to hold.  When writing, try the following:  Stabilize forearm on the table.  Take your time  as rushing/being stressed can increase tremors.  Try a felt-tipped pen, it does not glide as much.  Avoid gel pens ( they move to much ).  Consider using pre-printed labels with your name and address (carry them with you when you go out) or you can get stamps with your address or signature on it.  Use a small tape recorder to record messages/reminders for yourself.  Use pens with bigger grips.  When brushing your teeth, putting on make-up, or styling hair, try the following:  Use an electric toothbrush.  Use items with built-up grips.  Stabilize your elbows against your body or on the counter.  Use long-handled brushes/combs.  Use a hair dryer with a stand.  In general:  Avoid stress, fatigue or rushing as this can increase tremors.  Sit down for activities that require more control/coordination.  Perform "flicks".

## 2018-06-24 LAB — CBC WITH DIFFERENTIAL/PLATELET
BASOS ABS: 61 {cells}/uL (ref 0–200)
Basophils Relative: 1 %
EOS ABS: 128 {cells}/uL (ref 15–500)
Eosinophils Relative: 2.1 %
HEMATOCRIT: 44.5 % (ref 38.5–50.0)
Hemoglobin: 15.5 g/dL (ref 13.2–17.1)
Lymphs Abs: 2141 cells/uL (ref 850–3900)
MCH: 30.5 pg (ref 27.0–33.0)
MCHC: 34.8 g/dL (ref 32.0–36.0)
MCV: 87.4 fL (ref 80.0–100.0)
MPV: 10.4 fL (ref 7.5–12.5)
Monocytes Relative: 11.3 %
NEUTROS PCT: 50.5 %
Neutro Abs: 3081 cells/uL (ref 1500–7800)
Platelets: 240 10*3/uL (ref 140–400)
RBC: 5.09 10*6/uL (ref 4.20–5.80)
RDW: 13.1 % (ref 11.0–15.0)
Total Lymphocyte: 35.1 %
WBC: 6.1 10*3/uL (ref 3.8–10.8)
WBCMIX: 689 {cells}/uL (ref 200–950)

## 2018-06-24 LAB — COMPLETE METABOLIC PANEL WITH GFR
AG Ratio: 2 (calc) (ref 1.0–2.5)
ALT: 26 U/L (ref 9–46)
AST: 22 U/L (ref 10–35)
Albumin: 4.7 g/dL (ref 3.6–5.1)
Alkaline phosphatase (APISO): 84 U/L (ref 40–115)
BUN: 18 mg/dL (ref 7–25)
CALCIUM: 10.4 mg/dL — AB (ref 8.6–10.3)
CHLORIDE: 101 mmol/L (ref 98–110)
CO2: 32 mmol/L (ref 20–32)
Creat: 1.24 mg/dL (ref 0.70–1.25)
GFR, EST AFRICAN AMERICAN: 73 mL/min/{1.73_m2} (ref >=60)
GFR, EST NON AFRICAN AMERICAN: 63 mL/min/{1.73_m2} (ref >=60)
Globulin: 2.4 g/dL (calc) (ref 1.9–3.7)
Glucose, Bld: 91 mg/dL (ref 65–99)
POTASSIUM: 4.8 mmol/L (ref 3.5–5.3)
Sodium: 139 mmol/L (ref 135–146)
TOTAL PROTEIN: 7.1 g/dL (ref 6.1–8.1)
Total Bilirubin: 0.7 mg/dL (ref 0.2–1.2)

## 2018-06-24 LAB — LIPID PANEL
CHOL/HDL RATIO: 4 (calc) (ref <5.0)
Cholesterol: 202 mg/dL — ABNORMAL HIGH (ref <200)
HDL: 51 mg/dL (ref >40)
LDL Cholesterol (Calc): 117 mg/dL (calc) — ABNORMAL HIGH
NON-HDL CHOLESTEROL (CALC): 151 mg/dL — AB (ref <130)
TRIGLYCERIDES: 227 mg/dL — AB (ref <150)

## 2018-06-24 LAB — TSH: TSH: 1.27 mIU/L (ref 0.40–4.50)

## 2018-06-30 ENCOUNTER — Other Ambulatory Visit: Payer: Self-pay | Admitting: Internal Medicine

## 2018-06-30 MED ORDER — LEVOTHYROXINE SODIUM 50 MCG PO TABS: 50.0000 ug | ORAL_TABLET | Freq: Every day | ORAL | 3 refills | Status: DC

## 2018-07-20 ENCOUNTER — Other Ambulatory Visit: Payer: Self-pay | Admitting: Adult Health

## 2018-07-20 DIAGNOSIS — I10 Essential (primary) hypertension: Secondary | ICD-10-CM

## 2018-07-20 DIAGNOSIS — G250 Essential tremor: Secondary | ICD-10-CM

## 2018-07-21 ENCOUNTER — Other Ambulatory Visit: Payer: Self-pay | Admitting: Adult Health

## 2018-09-05 DIAGNOSIS — H25093 Other age-related incipient cataract, bilateral: Secondary | ICD-10-CM | POA: Diagnosis not present

## 2018-09-05 DIAGNOSIS — H524 Presbyopia: Secondary | ICD-10-CM | POA: Diagnosis not present

## 2018-09-05 DIAGNOSIS — I10 Essential (primary) hypertension: Secondary | ICD-10-CM | POA: Diagnosis not present

## 2018-09-16 ENCOUNTER — Ambulatory Visit (INDEPENDENT_AMBULATORY_CARE_PROVIDER_SITE_OTHER): Payer: 59

## 2018-09-16 VITALS — Temp 97.4°F

## 2018-09-16 DIAGNOSIS — Z23 Encounter for immunization: Secondary | ICD-10-CM | POA: Diagnosis not present

## 2018-11-20 ENCOUNTER — Encounter: Payer: Self-pay | Admitting: Internal Medicine
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
## 2018-11-20 NOTE — Patient Instructions (Signed)

## 2018-11-21 ENCOUNTER — Encounter: Payer: Self-pay | Admitting: Internal Medicine

## 2018-11-21 ENCOUNTER — Ambulatory Visit (INDEPENDENT_AMBULATORY_CARE_PROVIDER_SITE_OTHER): Payer: 59 | Admitting: Internal Medicine

## 2018-11-21 VITALS — BP 134/84 | HR 64 | Temp 98.2°F | Ht 74.0 in | Wt 215.8 lb

## 2018-11-21 DIAGNOSIS — Z8249 Family history of ischemic heart disease and other diseases of the circulatory system: Secondary | ICD-10-CM

## 2018-11-21 DIAGNOSIS — Z136 Encounter for screening for cardiovascular disorders: Secondary | ICD-10-CM | POA: Diagnosis not present

## 2018-11-21 DIAGNOSIS — Z111 Encounter for screening for respiratory tuberculosis: Secondary | ICD-10-CM | POA: Diagnosis not present

## 2018-11-21 DIAGNOSIS — I447 Left bundle-branch block, unspecified: Secondary | ICD-10-CM

## 2018-11-21 DIAGNOSIS — E039 Hypothyroidism, unspecified: Secondary | ICD-10-CM | POA: Diagnosis not present

## 2018-11-21 DIAGNOSIS — Z1211 Encounter for screening for malignant neoplasm of colon: Secondary | ICD-10-CM

## 2018-11-21 DIAGNOSIS — E782 Mixed hyperlipidemia: Secondary | ICD-10-CM | POA: Diagnosis not present

## 2018-11-21 DIAGNOSIS — Z79899 Other long term (current) drug therapy: Secondary | ICD-10-CM

## 2018-11-21 DIAGNOSIS — I10 Essential (primary) hypertension: Secondary | ICD-10-CM

## 2018-11-21 DIAGNOSIS — Z0000 Encounter for general adult medical examination without abnormal findings: Secondary | ICD-10-CM | POA: Diagnosis not present

## 2018-11-21 DIAGNOSIS — R7303 Prediabetes: Secondary | ICD-10-CM

## 2018-11-21 DIAGNOSIS — R5383 Other fatigue: Secondary | ICD-10-CM

## 2018-11-21 DIAGNOSIS — R7309 Other abnormal glucose: Secondary | ICD-10-CM

## 2018-11-21 DIAGNOSIS — Z125 Encounter for screening for malignant neoplasm of prostate: Secondary | ICD-10-CM | POA: Diagnosis not present

## 2018-11-21 DIAGNOSIS — R251 Tremor, unspecified: Secondary | ICD-10-CM

## 2018-11-21 DIAGNOSIS — E559 Vitamin D deficiency, unspecified: Secondary | ICD-10-CM

## 2018-11-23 ENCOUNTER — Encounter: Payer: Self-pay | Admitting: Internal Medicine

## 2018-11-24 LAB — URINALYSIS, ROUTINE W REFLEX MICROSCOPIC
Bacteria, UA: NONE SEEN /HPF
Bilirubin Urine: NEGATIVE
Glucose, UA: NEGATIVE
Hgb urine dipstick: NEGATIVE
Hyaline Cast: NONE SEEN /LPF
Ketones, ur: NEGATIVE
Leukocytes, UA: NEGATIVE
Nitrite: NEGATIVE
RBC / HPF: NONE SEEN /HPF (ref 0–2)
Specific Gravity, Urine: 1.018 (ref 1.001–1.03)
Squamous Epithelial / HPF: NONE SEEN /HPF (ref <=5)
WBC, UA: NONE SEEN /HPF (ref 0–5)
pH: 7.5 (ref 5.0–8.0)

## 2018-11-24 LAB — HEMOGLOBIN A1C
HEMOGLOBIN A1C: 5.3 %{Hb} (ref <5.7)
MEAN PLASMA GLUCOSE: 105 (calc)
eAG (mmol/L): 5.8 (calc)

## 2018-11-24 LAB — CBC WITH DIFFERENTIAL/PLATELET
ABSOLUTE MONOCYTES: 573 {cells}/uL (ref 200–950)
BASOS ABS: 57 {cells}/uL (ref 0–200)
Basophils Relative: 0.9 %
EOS ABS: 101 {cells}/uL (ref 15–500)
Eosinophils Relative: 1.6 %
HCT: 49.9 % (ref 38.5–50.0)
Hemoglobin: 16.9 g/dL (ref 13.2–17.1)
Lymphs Abs: 1846 cells/uL (ref 850–3900)
MCH: 30.2 pg (ref 27.0–33.0)
MCHC: 33.9 g/dL (ref 32.0–36.0)
MCV: 89.1 fL (ref 80.0–100.0)
MONOS PCT: 9.1 %
MPV: 10.6 fL (ref 7.5–12.5)
NEUTROS PCT: 59.1 %
Neutro Abs: 3723 cells/uL (ref 1500–7800)
PLATELETS: 249 10*3/uL (ref 140–400)
RBC: 5.6 10*6/uL (ref 4.20–5.80)
RDW: 13.3 % (ref 11.0–15.0)
TOTAL LYMPHOCYTE: 29.3 %
WBC: 6.3 10*3/uL (ref 3.8–10.8)

## 2018-11-24 LAB — MICROALBUMIN / CREATININE URINE RATIO
Creatinine, Urine: 90 mg/dL (ref 20–320)
Microalb Creat Ratio: 138 mcg/mg creat — ABNORMAL HIGH (ref <30)
Microalb, Ur: 12.4 mg/dL

## 2018-11-24 LAB — COMPLETE METABOLIC PANEL WITH GFR
AG RATIO: 1.7 (calc) (ref 1.0–2.5)
ALT: 36 U/L (ref 9–46)
AST: 25 U/L (ref 10–35)
Albumin: 4.8 g/dL (ref 3.6–5.1)
Alkaline phosphatase (APISO): 83 U/L (ref 40–115)
BUN: 18 mg/dL (ref 7–25)
CHLORIDE: 101 mmol/L (ref 98–110)
CO2: 30 mmol/L (ref 20–32)
Calcium: 10 mg/dL (ref 8.6–10.3)
Creat: 1.1 mg/dL (ref 0.70–1.25)
GFR, Est African American: 84 mL/min/{1.73_m2} (ref >=60)
GFR, Est Non African American: 73 mL/min/{1.73_m2} (ref >=60)
Globulin: 2.8 g/dL (calc) (ref 1.9–3.7)
Glucose, Bld: 69 mg/dL (ref 65–99)
Potassium: 4.4 mmol/L (ref 3.5–5.3)
SODIUM: 141 mmol/L (ref 135–146)
TOTAL PROTEIN: 7.6 g/dL (ref 6.1–8.1)
Total Bilirubin: 0.7 mg/dL (ref 0.2–1.2)

## 2018-11-24 LAB — PSA: PSA: 1.4 ng/mL (ref <=4.0)

## 2018-11-24 LAB — LIPID PANEL
Cholesterol: 219 mg/dL — ABNORMAL HIGH (ref <200)
HDL: 55 mg/dL (ref >40)
LDL Cholesterol (Calc): 130 mg/dL (calc) — ABNORMAL HIGH
Non-HDL Cholesterol (Calc): 164 mg/dL (calc) — ABNORMAL HIGH (ref <130)
Total CHOL/HDL Ratio: 4 (calc) (ref <5.0)
Triglycerides: 195 mg/dL — ABNORMAL HIGH (ref <150)

## 2018-11-24 LAB — VITAMIN B12: Vitamin B-12: 472 pg/mL (ref 200–1100)

## 2018-11-24 LAB — TESTOSTERONE: Testosterone: 383 ng/dL (ref 250–827)

## 2018-11-24 LAB — TSH: TSH: 1.23 mIU/L (ref 0.40–4.50)

## 2018-11-24 LAB — IRON, TOTAL/TOTAL IRON BINDING CAP
%SAT: 43 % (calc) (ref 20–48)
IRON: 154 ug/dL (ref 50–180)
TIBC: 360 mcg/dL (calc) (ref 250–425)

## 2018-11-24 LAB — INSULIN, RANDOM: INSULIN: 29 u[IU]/mL — AB (ref 2.0–19.6)

## 2018-11-24 LAB — MAGNESIUM: MAGNESIUM: 2 mg/dL (ref 1.5–2.5)

## 2018-11-24 LAB — VITAMIN D 25 HYDROXY (VIT D DEFICIENCY, FRACTURES): VIT D 25 HYDROXY: 44 ng/mL (ref 30–100)

## 2019-02-05 ENCOUNTER — Ambulatory Visit: Payer: Self-pay | Admitting: Adult Health

## 2019-02-24 ENCOUNTER — Other Ambulatory Visit: Payer: Self-pay | Admitting: *Deleted

## 2019-02-24 MED ORDER — ROSUVASTATIN CALCIUM 20 MG PO TABS: 20.0000 mg | ORAL_TABLET | Freq: Every day | ORAL | 1 refills | Status: DC

## 2019-02-26 NOTE — Progress Notes (Signed)
Virtual Visit via Telephone Note  I connected with Gerald [name] on 02/27/19 at 10:15 AM EDT by telephone and verified that I am speaking with the correct person using two identifiers.  Location: Patient: home Provider: [facility] office   I discussed the limitations, risks, security and privacy concerns of performing an evaluation and management service by telephone and the availability of in person appointments. I also discussed with the patient that there may be a patient responsible charge related to this service. The patient expressed understanding and agreed to proceed.   Follow Up Instructions:    I discussed the assessment and treatment plan with the patient. The patient was provided an opportunity to ask questions and all were answered. The patient agreed with the plan and demonstrated an understanding of the instructions.   The patient was advised to call back or seek an in-person evaluation if the symptoms worsen or if the condition fails to improve as anticipated.  I provided 23 minutes of non-face-to-face time during this encounter.   Gerald Maker, NP    Assessment and Plan:  Hypertension:  -Continue medication,  -monitor blood pressure at home.  -Continue DASH diet.   -Reminder to go to the ER if any CP, SOB, nausea, dizziness, severe HA, changes vision/speech, left arm numbness and tingling, and jaw pain.  Cholesterol: -cont crestor -above goal last visit - admits wasn't taking regularly, have refilled medication since then and he will start taking daily -Continue diet and exercise.  -Check cholesterol.   Abnormal glucose Recent A1Cs at goal Discussed diet/exercise, weight management  Defer A1C; check CMP at routine OVs  Vitamin D Def: -below goal last visit  -wasn't taking supplement regularly but has been taking daily since -continue supplement for goal of 60-100 -check vitamin D annually or PRN  Hypothyroidism continue medications the same- TSH has  been stable without dose change for several years reminded to take on an empty stomach 30-63mins before food.  check TSH level next OV  Labs deferred to next OV; televisit; last labs reviewed, no urgent follow up needed  Continue diet and meds as discussed. Further disposition pending results of labs. Future Appointments  Date Time Provider Department Center  06/03/2019  9:30 AM [name], MD [facility] None  12/10/2019  9:00 AM [name], MD [facility] None    HPI 63 y.o. male  presents for 3 month follow up with hypertension, hyperlipidemia, glucose management and vitamin D.  Has 11 grandkids,  Most recently in [name].   BMI is Body mass index is 27.22 kg/m., he has been working on diet and exercise, eating more at home, smaller portions. He is fairly active at home watching grand kids, walks 1/2 mile a few days a week. He admits to drinking a lot of coffee, drinks 4 bottles.  Wt Readings from Last 3 Encounters:  02/27/19 212 lb (96.2 kg)  11/21/18 215 lb 12.8 oz (97.9 kg)  06/23/18 215 lb (97.5 kg)    His blood pressure has been controlled at home, he is taking enalapril 1/2 daily and he is on propanolol ER for tremor, takes a 1/2 tab valium PRN breakthrough tremors (takes 3-4 times per month), today their BP is BP: 126/79.  He does workout. He denies chest pain, shortness of breath, dizziness.     He is on cholesterol medication (rosuvastatin 20 mg, admits was not taking daily at last visit, taking day) and denies myalgias. His cholesterol is not at goal.  The cholesterol last visit was:   Lab Results  Component Value Date   CHOL 219 (H) 11/21/2018   HDL 55 11/21/2018   LDLCALC 130 (H) 11/21/2018   TRIG 195 (H) 11/21/2018   CHOLHDL 4.0 11/21/2018    He has been working on diet and exercise for hx of prediabetes, recently well controlled, and denies foot ulcerations, hyperglycemia, hypoglycemia , increased appetite, nausea, paresthesia of the feet,  polydipsia, polyuria, visual disturbances, vomiting and weight loss. Last A1C in the office was:  Lab Results  Component Value Date   HGBA1C 5.3 11/21/2018   Patient is on Vitamin D supplement, taking 5000 IU daily but remains below goal of 60-100:   Lab Results  Component Value Date   VD25OH 51 11/21/2018     He is on thyroid medication. His medication was not changed last visit.   Lab Results  Component Value Date   TSH 1.23 11/21/2018    Current Medications:  Current Outpatient Medications on File Prior to Visit  Medication Sig Dispense Refill  . aspirin 81 MG tablet Take 81 mg by mouth daily.    . Cholecalciferol (VITAMIN D PO) Take 5,000 Units by mouth daily.    Marland Kitchen DIAZEPAM PO Take by mouth as needed.    . enalapril (VASOTEC) 20 MG tablet TAKE 1 TABLET BY MOUTH EVERY DAY FOR BLOOD PRESSURE (Patient taking differently: Takes 1/2 tablet daily) 90 tablet 3  . levothyroxine (SYNTHROID, LEVOTHROID) 50 MCG tablet Take 1 tablet (50 mcg total) by mouth daily. 90 tablet 3  . Magnesium 250 MG TABS Take 250 mg by mouth daily.    . Multiple Vitamins-Minerals (MULTIVITAMIN PO) Take by mouth.    . propranolol ER (INDERAL LA) 120 MG 24 hr capsule Take 1 capsule daily for BP & Tremor 90 capsule 3  . rosuvastatin (CRESTOR) 20 MG tablet Take 1 tablet (20 mg total) by mouth daily. 90 tablet 1   No current facility-administered medications on file prior to visit.     Medical History:  Past Medical History:  Diagnosis Date  . Hyperlipidemia   . Hypertension   . LBBB (left bundle branch block)   . Prediabetes   . Thyroid disease    HYPO  . Varicose veins   . Vitamin D deficiency     Allergies:  Allergies  Allergen Reactions  . Simvastatin   . Iodine Rash     Review of Systems:  Review of Systems  Constitutional: Negative for chills, fever and malaise/fatigue.  HENT: Negative for congestion, ear pain and sore throat.   Eyes: Negative.   Respiratory: Negative for cough, shortness  of breath and wheezing.   Cardiovascular: Negative for chest pain, palpitations and leg swelling.  Gastrointestinal: Negative for abdominal pain, blood in stool, constipation, diarrhea, heartburn and melena.  Genitourinary: Negative.   Skin: Negative.   Neurological: Negative for dizziness, sensory change, loss of consciousness and headaches.  Psychiatric/Behavioral: Negative for depression. The patient is not nervous/anxious and does not have insomnia.     Family history- Review and unchanged  Social history- Review and unchanged  Physical Exam: BP 126/79   Pulse (!) 50   Temp 98.8 F (37.1 C)   Wt 212 lb (96.2 kg)   BMI 27.22 kg/m  Wt Readings from Last 3 Encounters:  02/27/19 212 lb (96.2 kg)  11/21/18 215 lb 12.8 oz (97.9 kg)  06/23/18 215 lb (97.5 kg)   General : Well sounding patient in no apparent distress HEENT: no  hoarseness, no cough for duration of visit Lungs: speaks in complete sentences, no audible wheezing, no apparent distress Neurological: alert, oriented x 3 Psychiatric: pleasant, judgement appropriate    Gerald Maker, NP 10:20 AM [facility]

## 2019-02-27 ENCOUNTER — Encounter: Payer: Self-pay | Admitting: Adult Health

## 2019-02-27 ENCOUNTER — Ambulatory Visit: Payer: 59 | Admitting: Adult Health

## 2019-02-27 ENCOUNTER — Other Ambulatory Visit: Payer: Self-pay

## 2019-02-27 ENCOUNTER — Ambulatory Visit: Payer: Self-pay | Admitting: Adult Health

## 2019-02-27 VITALS — BP 126/79 | HR 50 | Temp 98.8°F | Wt 212.0 lb

## 2019-02-27 DIAGNOSIS — I10 Essential (primary) hypertension: Secondary | ICD-10-CM

## 2019-02-27 DIAGNOSIS — Z79899 Other long term (current) drug therapy: Secondary | ICD-10-CM

## 2019-02-27 DIAGNOSIS — E782 Mixed hyperlipidemia: Secondary | ICD-10-CM | POA: Diagnosis not present

## 2019-02-27 DIAGNOSIS — E559 Vitamin D deficiency, unspecified: Secondary | ICD-10-CM

## 2019-02-27 DIAGNOSIS — I447 Left bundle-branch block, unspecified: Secondary | ICD-10-CM

## 2019-02-27 DIAGNOSIS — Z6827 Body mass index (BMI) 27.0-27.9, adult: Secondary | ICD-10-CM

## 2019-02-27 DIAGNOSIS — R251 Tremor, unspecified: Secondary | ICD-10-CM

## 2019-02-27 DIAGNOSIS — E039 Hypothyroidism, unspecified: Secondary | ICD-10-CM | POA: Diagnosis not present

## 2019-05-14 ENCOUNTER — Ambulatory Visit: Payer: Self-pay | Admitting: Internal Medicine

## 2019-06-02 ENCOUNTER — Encounter: Payer: Self-pay | Admitting: Internal Medicine
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
## 2019-06-02 NOTE — Patient Instructions (Signed)

## 2019-06-03 ENCOUNTER — Ambulatory Visit (INDEPENDENT_AMBULATORY_CARE_PROVIDER_SITE_OTHER): Payer: 59 | Admitting: Internal Medicine

## 2019-06-03 ENCOUNTER — Other Ambulatory Visit: Payer: Self-pay

## 2019-06-03 VITALS — BP 122/74 | HR 60 | Temp 97.3°F | Resp 16 | Ht 74.5 in | Wt 218.8 lb

## 2019-06-03 DIAGNOSIS — R7309 Other abnormal glucose: Secondary | ICD-10-CM | POA: Diagnosis not present

## 2019-06-03 DIAGNOSIS — Z79899 Other long term (current) drug therapy: Secondary | ICD-10-CM

## 2019-06-03 DIAGNOSIS — I10 Essential (primary) hypertension: Secondary | ICD-10-CM

## 2019-06-03 DIAGNOSIS — E782 Mixed hyperlipidemia: Secondary | ICD-10-CM | POA: Diagnosis not present

## 2019-06-03 DIAGNOSIS — E559 Vitamin D deficiency, unspecified: Secondary | ICD-10-CM | POA: Diagnosis not present

## 2019-06-04 ENCOUNTER — Other Ambulatory Visit: Payer: Self-pay | Admitting: Internal Medicine

## 2019-06-04 LAB — CBC WITH DIFFERENTIAL/PLATELET
Absolute Monocytes: 475 cells/uL (ref 200–950)
Basophils Absolute: 60 cells/uL (ref 0–200)
Basophils Relative: 1.2 %
Eosinophils Absolute: 140 cells/uL (ref 15–500)
Eosinophils Relative: 2.8 %
HCT: 47.1 % (ref 38.5–50.0)
Hemoglobin: 16.3 g/dL (ref 13.2–17.1)
Lymphs Abs: 1990 cells/uL (ref 850–3900)
MCH: 31 pg (ref 27.0–33.0)
MCHC: 34.6 g/dL (ref 32.0–36.0)
MCV: 89.5 fL (ref 80.0–100.0)
MPV: 10.5 fL (ref 7.5–12.5)
Monocytes Relative: 9.5 %
Neutro Abs: 2335 cells/uL (ref 1500–7800)
Neutrophils Relative %: 46.7 %
Platelets: 225 10*3/uL (ref 140–400)
RBC: 5.26 10*6/uL (ref 4.20–5.80)
RDW: 13.3 % (ref 11.0–15.0)
Total Lymphocyte: 39.8 %
WBC: 5 10*3/uL (ref 3.8–10.8)

## 2019-06-04 LAB — COMPLETE METABOLIC PANEL WITH GFR
AG Ratio: 1.9 (calc) (ref 1.0–2.5)
ALT: 23 U/L (ref 9–46)
AST: 15 U/L (ref 10–35)
Albumin: 4.5 g/dL (ref 3.6–5.1)
Alkaline phosphatase (APISO): 82 U/L (ref 35–144)
BUN: 11 mg/dL (ref 7–25)
CO2: 28 mmol/L (ref 20–32)
Calcium: 9.7 mg/dL (ref 8.6–10.3)
Chloride: 102 mmol/L (ref 98–110)
Creat: 1.14 mg/dL (ref 0.70–1.25)
GFR, Est African American: 80 mL/min/{1.73_m2} (ref >=60)
GFR, Est Non African American: 69 mL/min/{1.73_m2} (ref >=60)
Globulin: 2.4 g/dL (calc) (ref 1.9–3.7)
Glucose, Bld: 101 mg/dL — ABNORMAL HIGH (ref 65–99)
Potassium: 4.4 mmol/L (ref 3.5–5.3)
Sodium: 137 mmol/L (ref 135–146)
Total Bilirubin: 0.6 mg/dL (ref 0.2–1.2)
Total Protein: 6.9 g/dL (ref 6.1–8.1)

## 2019-06-04 LAB — HEMOGLOBIN A1C
Hgb A1c MFr Bld: 5.3 % of total Hgb (ref <5.7)
Mean Plasma Glucose: 105 (calc)
eAG (mmol/L): 5.8 (calc)

## 2019-06-04 LAB — MAGNESIUM: Magnesium: 2.1 mg/dL (ref 1.5–2.5)

## 2019-06-04 LAB — LIPID PANEL
Cholesterol: 230 mg/dL — ABNORMAL HIGH (ref <200)
HDL: 49 mg/dL (ref >=40)
LDL Cholesterol (Calc): 150 mg/dL (calc) — ABNORMAL HIGH
Non-HDL Cholesterol (Calc): 181 mg/dL (calc) — ABNORMAL HIGH (ref <130)
Total CHOL/HDL Ratio: 4.7 (calc) (ref <5.0)
Triglycerides: 169 mg/dL — ABNORMAL HIGH (ref <150)

## 2019-06-04 LAB — VITAMIN D 25 HYDROXY (VIT D DEFICIENCY, FRACTURES): Vit D, 25-Hydroxy: 39 ng/mL (ref 30–100)

## 2019-06-04 LAB — INSULIN, RANDOM: Insulin: 6 u[IU]/mL

## 2019-06-04 LAB — TSH: TSH: 1.27 mIU/L (ref 0.40–4.50)

## 2019-06-04 MED ORDER — NEXLIZET 180-10 MG PO TABS: ORAL_TABLET | ORAL | 3 refills | Status: DC

## 2019-07-31 ENCOUNTER — Other Ambulatory Visit: Payer: Self-pay | Admitting: Internal Medicine

## 2019-08-16 ENCOUNTER — Other Ambulatory Visit: Payer: Self-pay | Admitting: Internal Medicine

## 2019-08-16 DIAGNOSIS — G250 Essential tremor: Secondary | ICD-10-CM

## 2019-08-16 DIAGNOSIS — I10 Essential (primary) hypertension: Secondary | ICD-10-CM

## 2019-08-16 MED ORDER — PROPRANOLOL HCL ER 120 MG PO CP24: ORAL_CAPSULE | ORAL | 3 refills | Status: DC

## 2019-08-17 MED ORDER — PROPRANOLOL HCL ER 120 MG PO CP24: ORAL_CAPSULE | ORAL | 3 refills | Status: DC

## 2019-08-17 NOTE — Addendum Note (Signed)
Addended by: Vicie Mutters R on: 08/17/2019 08:09 AM   Modules accepted: Orders

## 2019-09-02 NOTE — Progress Notes (Signed)
Assessment and Plan:  Hypertension:  -Continue medication,  -monitor blood pressure at home.  -Continue DASH diet.   -Reminder to go to the ER if any CP, SOB, nausea, dizziness, severe HA, changes vision/speech, left arm numbness and tingling, and jaw pain.  Cholesterol: -cont crestor -at goal -Continue diet and exercise.  -Check cholesterol.   Abnormal glucose -Continue diet and exercise.   Vitamin D Def: -continue medications.   Hypothyroidism -cont levothyroxine -TSH -dose adjust if necessary   Continue diet and meds as discussed. Further disposition pending results of labs. Future Appointments  Date Time Provider [place]  12/10/2019  9:00 AM Unk Pinto, MD [facility] None    HPI 63 y.o. male  presents for 3 month follow up with hypertension, hyperlipidemia, prediabetes and vitamin D.  Has 11 grandkids, 11th due in Nov little girl, 73 month old and 41 year old, holly's kids come and stay with them. Working half days.    His blood pressure has been controlled at home,  enalapril to 1/2 pill and he is on propanolol ER for tremor, today their BP is BP: 130/70.   He does workout. He denies chest pain, shortness of breath, dizziness.     He is on cholesterol medication, crestor 20 mg and denies myalgias. His cholesterol is at goal. The cholesterol last visit was:   Lab Results  Component Value Date   CHOL 230 (H) 06/03/2019   HDL 49 06/03/2019   LDLCALC 150 (H) 06/03/2019   TRIG 169 (H) 06/03/2019   CHOLHDL 4.7 06/03/2019    He has been working on diet and exercise for prediabetes, and denies foot ulcerations, hyperglycemia, hypoglycemia , increased appetite, nausea, paresthesia of the feet, polydipsia, polyuria, visual disturbances, vomiting and weight loss. Last A1C in the office was:  Lab Results  Component Value Date   HGBA1C 5.3 06/03/2019   Patient is on Vitamin D supplement.  Lab Results  Component Value Date   VD25OH 39 06/03/2019     He is  on thyroid medication. His medication was not changed last visit.   Lab Results  Component Value Date   TSH 1.27 06/03/2019  .  BMI is Body mass index is 26.73 kg/m., he is working on diet and exercise. Wt Readings from Last 3 Encounters:  09/03/19 211 lb (95.7 kg)  06/03/19 218 lb 12.8 oz (99.2 kg)  02/27/19 212 lb (96.2 kg)     Current Medications:  Current Outpatient Medications on File Prior to Visit  Medication Sig Dispense Refill  . aspirin 81 MG tablet Take 81 mg by mouth daily.    . Cholecalciferol (VITAMIN D PO) Take 5,000 Units by mouth daily.    Marland Kitchen DIAZEPAM PO Take by mouth as needed.    . enalapril (VASOTEC) 20 MG tablet TAKE 1 TABLET BY MOUTH EVERY DAY FOR BLOOD PRESSURE (Patient taking differently: Takes 1/2 tablet daily) 90 tablet 3  . levothyroxine (EUTHYROX) 50 MCG tablet Take 1 tablet daily on an empty stomach with only water for 30 minutes & no Antacid meds, Calcium or Magnesium for 4 hours & avoid Biotin 90 tablet 3  . Magnesium 250 MG TABS Take 250 mg by mouth daily.    . Multiple Vitamins-Minerals (MULTIVITAMIN PO) Take by mouth.    . propranolol ER (INDERAL LA) 120 MG 24 hr capsule Take 1 capsule Daily for BP & Tremor 90 capsule 3  . rosuvastatin (CRESTOR) 20 MG tablet Take 1 tablet (20 mg total) by mouth daily. [place]  tablet 1   No current facility-administered medications on file prior to visit.     Medical History:  Past Medical History:  Diagnosis Date  . Hyperlipidemia   . Hypertension   . LBBB (left bundle branch block)   . Prediabetes   . Thyroid disease    HYPO  . Varicose veins   . Vitamin D deficiency     Allergies:  Allergies  Allergen Reactions  . Simvastatin   . Iodine Rash     Review of Systems:  Review of Systems  Constitutional: Negative for chills, fever and malaise/fatigue.  HENT: Negative for congestion, ear pain and sore throat.   Eyes: Negative.   Respiratory: Negative for cough, shortness of breath and wheezing.    Cardiovascular: Negative for chest pain, palpitations and leg swelling.  Gastrointestinal: Negative for abdominal pain, blood in stool, constipation, diarrhea, heartburn and melena.  Genitourinary: Negative.   Skin: Negative.   Neurological: Negative for dizziness, sensory change, loss of consciousness and headaches.  Psychiatric/Behavioral: Negative for depression. The patient is not nervous/anxious and does not have insomnia.     Family history- Review and unchanged  Social history- Review and unchanged  Physical Exam: BP 130/70   Pulse (!) 59   Temp 97.7 F (36.5 C)   Wt 211 lb (95.7 kg)   SpO2 98%   BMI 26.73 kg/m  Wt Readings from Last 3 Encounters:  09/03/19 211 lb (95.7 kg)  06/03/19 218 lb 12.8 oz (99.2 kg)  02/27/19 212 lb (96.2 kg)    General Appearance: Well nourished well developed, in no apparent distress. Eyes: PERRLA, EOMs, conjunctiva no swelling or erythema ENT/Mouth: Ear canals normal without obstruction, swelling, erythma, discharge.  TMs normal bilaterally.  Oropharynx moist, clear, without exudate, or postoropharyngeal swelling. Neck: Supple, thyroid normal,no cervical adenopathy  Respiratory: Respiratory effort normal, Breath sounds clear A&P without rhonchi, wheeze, or rale.  No retractions, no accessory usage. Cardio: RRR with no MRGs. Brisk peripheral pulses without edema.  Abdomen: Soft, + BS,  Non tender, no guarding, rebound, hernias, masses. Musculoskeletal: Full ROM, 5/5 strength, Normal gait Skin: Warm, dry without rashes, lesions, ecchymosis.  Neuro: Awake and oriented X 3, Cranial nerves intact. Normal muscle tone, no cerebellar symptoms. Intention tremor and slight head tremor.  Psych: Normal affect, Insight and Judgment appropriate.    Quentin Mulling, PA-C 2:45 PM [facility]

## 2019-09-03 ENCOUNTER — Encounter: Payer: Self-pay | Admitting: Physician Assistant

## 2019-09-03 ENCOUNTER — Other Ambulatory Visit: Payer: Self-pay

## 2019-09-03 ENCOUNTER — Ambulatory Visit (INDEPENDENT_AMBULATORY_CARE_PROVIDER_SITE_OTHER): Payer: 59 | Admitting: Physician Assistant

## 2019-09-03 VITALS — BP 130/70 | HR 59 | Temp 97.7°F | Wt 211.0 lb

## 2019-09-03 DIAGNOSIS — E782 Mixed hyperlipidemia: Secondary | ICD-10-CM

## 2019-09-03 DIAGNOSIS — E559 Vitamin D deficiency, unspecified: Secondary | ICD-10-CM

## 2019-09-03 DIAGNOSIS — E039 Hypothyroidism, unspecified: Secondary | ICD-10-CM

## 2019-09-03 DIAGNOSIS — I10 Essential (primary) hypertension: Secondary | ICD-10-CM

## 2019-09-03 DIAGNOSIS — Z79899 Other long term (current) drug therapy: Secondary | ICD-10-CM

## 2019-09-03 NOTE — Patient Instructions (Signed)
Your LDL is not in range or at goal, goal is less than 70.  Your LDL is the bad cholesterol that can lead to heart attack and stroke. To lower your number you can decrease your fatty foods, red meat, cheese, milk and increase fiber like whole grains and veggies. You can also add a fiber supplement like Citracel or Benefiber, these do not cause gas and bloating and are safe to use. Since you have risk factors that make [place] want your number below 70, we need to adjust or add medications to get your number below goal.   GENERAL HEALTH GOALS  Know what a healthy weight is for you (roughly BMI <25) and aim to maintain this  Aim for 7+ servings of fruits and vegetables daily  70-80+ fluid ounces of water or unsweet tea for healthy kidneys  Limit to max 1 drink of alcohol per day; avoid smoking/tobacco  Limit animal fats in diet for cholesterol and heart health - choose grass fed whenever available  Avoid highly processed foods, and foods high in saturated/trans fats  Aim for low stress - take time to unwind and care for your mental health  Aim for 150 min of moderate intensity exercise weekly for heart health, and weights twice weekly for bone health  Aim for 7-9 hours of sleep daily

## 2019-09-04 LAB — COMPLETE METABOLIC PANEL WITH GFR
AG Ratio: 2.2 (calc) (ref 1.0–2.5)
ALT: 28 U/L (ref 9–46)
AST: 18 U/L (ref 10–35)
Albumin: 4.8 g/dL (ref 3.6–5.1)
Alkaline phosphatase (APISO): 80 U/L (ref 35–144)
BUN: 12 mg/dL (ref 7–25)
CO2: 27 mmol/L (ref 20–32)
Calcium: 9.8 mg/dL (ref 8.6–10.3)
Chloride: 103 mmol/L (ref 98–110)
Creat: 1.06 mg/dL (ref 0.70–1.25)
GFR, Est African American: 87 mL/min/{1.73_m2} (ref >=60)
GFR, Est Non African American: 75 mL/min/{1.73_m2} (ref >=60)
Globulin: 2.2 g/dL (calc) (ref 1.9–3.7)
Glucose, Bld: 136 mg/dL — ABNORMAL HIGH (ref 65–99)
Potassium: 4.2 mmol/L (ref 3.5–5.3)
Sodium: 139 mmol/L (ref 135–146)
Total Bilirubin: 0.6 mg/dL (ref 0.2–1.2)
Total Protein: 7 g/dL (ref 6.1–8.1)

## 2019-09-04 LAB — CBC WITH DIFFERENTIAL/PLATELET
Absolute Monocytes: 390 cells/uL (ref 200–950)
Basophils Absolute: 49 cells/uL (ref 0–200)
Basophils Relative: 0.8 %
Eosinophils Absolute: 110 cells/uL (ref 15–500)
Eosinophils Relative: 1.8 %
HCT: 45.6 % (ref 38.5–50.0)
Hemoglobin: 15.7 g/dL (ref 13.2–17.1)
Lymphs Abs: 2178 cells/uL (ref 850–3900)
MCH: 30.3 pg (ref 27.0–33.0)
MCHC: 34.4 g/dL (ref 32.0–36.0)
MCV: 87.9 fL (ref 80.0–100.0)
MPV: 10.9 fL (ref 7.5–12.5)
Monocytes Relative: 6.4 %
Neutro Abs: 3373 cells/uL (ref 1500–7800)
Neutrophils Relative %: 55.3 %
Platelets: 221 10*3/uL (ref 140–400)
RBC: 5.19 10*6/uL (ref 4.20–5.80)
RDW: 12.3 % (ref 11.0–15.0)
Total Lymphocyte: 35.7 %
WBC: 6.1 10*3/uL (ref 3.8–10.8)

## 2019-09-04 LAB — TSH: TSH: 1.05 mIU/L (ref 0.40–4.50)

## 2019-09-04 LAB — MAGNESIUM: Magnesium: 2 mg/dL (ref 1.5–2.5)

## 2019-09-04 LAB — LIPID PANEL
Cholesterol: 132 mg/dL (ref <200)
HDL: 39 mg/dL — ABNORMAL LOW (ref >=40)
LDL Cholesterol (Calc): 67 mg/dL (calc)
Non-HDL Cholesterol (Calc): 93 mg/dL (calc) (ref <130)
Total CHOL/HDL Ratio: 3.4 (calc) (ref <5.0)
Triglycerides: 179 mg/dL — ABNORMAL HIGH (ref <150)

## 2019-09-04 LAB — VITAMIN D 25 HYDROXY (VIT D DEFICIENCY, FRACTURES): Vit D, 25-Hydroxy: 49 ng/mL (ref 30–100)

## 2019-09-04 NOTE — Progress Notes (Signed)
PATIENT IS AWARE OF LAB RESULTS. -E WELCH

## 2019-09-07 ENCOUNTER — Other Ambulatory Visit: Payer: Self-pay

## 2019-09-07 ENCOUNTER — Ambulatory Visit: Payer: 59

## 2019-09-08 ENCOUNTER — Ambulatory Visit (INDEPENDENT_AMBULATORY_CARE_PROVIDER_SITE_OTHER): Payer: 59

## 2019-09-08 ENCOUNTER — Other Ambulatory Visit: Payer: Self-pay

## 2019-09-08 VITALS — Temp 97.9°F

## 2019-09-08 DIAGNOSIS — Z23 Encounter for immunization: Secondary | ICD-10-CM | POA: Diagnosis not present

## 2019-11-27 ENCOUNTER — Other Ambulatory Visit: Payer: Self-pay | Admitting: Internal Medicine

## 2019-12-05 ENCOUNTER — Other Ambulatory Visit: Payer: Self-pay | Admitting: Internal Medicine

## 2019-12-10 ENCOUNTER — Encounter: Payer: Self-pay | Admitting: Internal Medicine

## 2019-12-10 ENCOUNTER — Other Ambulatory Visit: Payer: Self-pay

## 2019-12-10 ENCOUNTER — Ambulatory Visit (INDEPENDENT_AMBULATORY_CARE_PROVIDER_SITE_OTHER): Payer: 59 | Admitting: Internal Medicine

## 2019-12-10 VITALS — BP 136/84 | HR 60 | Temp 97.0°F | Resp 16 | Ht 74.5 in | Wt 213.2 lb

## 2019-12-10 DIAGNOSIS — I447 Left bundle-branch block, unspecified: Secondary | ICD-10-CM

## 2019-12-10 DIAGNOSIS — I10 Essential (primary) hypertension: Secondary | ICD-10-CM

## 2019-12-10 DIAGNOSIS — Z111 Encounter for screening for respiratory tuberculosis: Secondary | ICD-10-CM

## 2019-12-10 DIAGNOSIS — Z1211 Encounter for screening for malignant neoplasm of colon: Secondary | ICD-10-CM

## 2019-12-10 DIAGNOSIS — Z79899 Other long term (current) drug therapy: Secondary | ICD-10-CM

## 2019-12-10 DIAGNOSIS — Z136 Encounter for screening for cardiovascular disorders: Secondary | ICD-10-CM | POA: Diagnosis not present

## 2019-12-10 DIAGNOSIS — Z8249 Family history of ischemic heart disease and other diseases of the circulatory system: Secondary | ICD-10-CM | POA: Diagnosis not present

## 2019-12-10 DIAGNOSIS — R5383 Other fatigue: Secondary | ICD-10-CM

## 2019-12-10 DIAGNOSIS — R7309 Other abnormal glucose: Secondary | ICD-10-CM

## 2019-12-10 DIAGNOSIS — Z0000 Encounter for general adult medical examination without abnormal findings: Secondary | ICD-10-CM

## 2019-12-10 DIAGNOSIS — Z0001 Encounter for general adult medical examination with abnormal findings: Secondary | ICD-10-CM

## 2019-12-10 DIAGNOSIS — Z125 Encounter for screening for malignant neoplasm of prostate: Secondary | ICD-10-CM

## 2019-12-10 DIAGNOSIS — E782 Mixed hyperlipidemia: Secondary | ICD-10-CM

## 2019-12-10 DIAGNOSIS — G250 Essential tremor: Secondary | ICD-10-CM

## 2019-12-10 DIAGNOSIS — E039 Hypothyroidism, unspecified: Secondary | ICD-10-CM

## 2019-12-10 DIAGNOSIS — E559 Vitamin D deficiency, unspecified: Secondary | ICD-10-CM

## 2019-12-10 NOTE — Patient Instructions (Signed)
- Link to sign up at [place] Web site for the Covid[place]19 vaccine   [email or web address]   Or call [phone]  +++++++++++++++++++++++++++++++++++  Vit D  & Vit C 1,000 mg   are recommended to help protect  against the Covid-19 and other Corona viruses.    Also it's recommended  to take  Zinc 50 mg  to help  protect against the Covid-19   and best place to get  is also on [employer]  and don't pay more than 6-8 cents /pill !  =============================== Coronavirus (COVID-19) Are you at risk?  Are you at risk for the Coronavirus (COVID-19)?  To be considered HIGH RISK for Coronavirus (COVID-19), you have to meet the following criteria:  . Traveled to [place], [place], [place], [place] or [place]; or in the [place] to [place], [place], [place]  . or [place]; and have fever, cough, and shortness of breath within the last 2 weeks of travel OR . Been in close contact with a person diagnosed with COVID-19 within the last 2 weeks and have  . fever, cough,and shortness of breath .  . IF YOU DO NOT MEET THESE CRITERIA, YOU ARE CONSIDERED LOW RISK FOR COVID-19.  What to do if you are HIGH RISK for COVID-19?  . If you are having a medical emergency, call 911. . Seek medical care right away. Before you go to a doctor's office, urgent care or emergency department, .  call ahead and tell them about your recent travel, contact with someone diagnosed with COVID-19  .  and your symptoms.  . You should receive instructions from your physician's office regarding next steps of care.  . When you arrive at healthcare provider, tell the healthcare staff immediately you have returned from  . visiting [place], [place], [place], [place] or [place]; or traveled in the [place] to [place], [place],  . [place] or [place] in the last two  weeks or you have been in close contact with a person diagnosed with  . COVID-19 in the last 2 weeks.   . Tell the health care staff about your symptoms: fever, cough and shortness of breath. . After you have been seen by a medical provider, you will be either: o Tested for (COVID-19) and discharged home on quarantine except to seek medical care if  o symptoms worsen, and asked to  - Stay home and avoid contact with others until you get your results (4-5 days)  - Avoid travel on public transportation if possible (such as bus, train, or airplane) or o Sent to the Emergency Department by EMS for evaluation, COVID-19 testing  and  o possible admission depending on your condition and test results.  What to do if you are LOW RISK for COVID-19?  Reduce your risk of any infection by using the same precautions used for avoiding the common cold or flu:  . Wash your hands often with soap and warm water for at least 20 seconds.  If soap and water are not readily available,  . use an alcohol-based hand sanitizer with at least 60% alcohol.  . If coughing or sneezing, cover your mouth and nose by coughing or sneezing into the elbow areas of your shirt or coat, .  into a tissue or into your sleeve (not your hands). . Avoid shaking hands with others and consider head nods or verbal greetings only. . Avoid touching your eyes, nose, or mouth with unwashed hands.  .   Avoid close contact with people who are sick. . Avoid places or events with large numbers of people in one location, like concerts or sporting events. . Carefully consider travel plans you have or are making. . If you are planning any travel outside or inside the [place], visit the CDC's Travelers' Health webpage for the latest health notices. . If you have some symptoms but not all symptoms, continue to monitor at home and seek medical attention  . if your symptoms worsen. . If you are having a medical emergency, call  911. >>>>>>>>>>>>>>>>>>>>>>>>>>>> Preventive Care for Adults  A healthy lifestyle and preventive care can promote health and wellness. Preventive health guidelines for men include the following key practices:  A routine yearly physical is a good way to check with your health care provider about your health and preventative screening. It is a chance to share any concerns and updates on your health and to receive a thorough exam.  Visit your dentist for a routine exam and preventative care every 6 months. Brush your teeth twice a day and floss once a day. Good oral hygiene prevents tooth decay and gum disease.  The frequency of eye exams is based on your age, health, family medical history, use of contact lenses, and other factors. Follow your health care provider's recommendations for frequency of eye exams.  Eat a healthy diet. Foods such as vegetables, fruits, whole grains, low-fat dairy products, and lean protein foods contain the nutrients you need without too many calories. Decrease your intake of foods high in solid fats, added sugars, and salt. Eat the right amount of calories for you. Get information about a proper diet from your health care provider, if necessary.  Regular physical exercise is one of the most important things you can do for your health. Most adults should get at least 150 minutes of moderate-intensity exercise (any activity that increases your heart rate and causes you to sweat) each week. In addition, most adults need muscle-strengthening exercises on 2 or more days a week.  Maintain a healthy weight. The body mass index (BMI) is a screening tool to identify possible weight problems. It provides an estimate of body fat based on height and weight. Your health care provider can find your BMI and can help you achieve or maintain a healthy weight. For adults 20 years and older:  A BMI below 18.5 is considered underweight.  A BMI of 18.5 to 24.9 is normal.  A BMI of 25 to  29.9 is considered overweight.  A BMI of 30 and above is considered obese.  Maintain normal blood lipids and cholesterol levels by exercising and minimizing your intake of saturated fat. Eat a balanced diet with plenty of fruit and vegetables. Blood tests for lipids and cholesterol should begin at age 20 and be repeated every 5 years. If your lipid or cholesterol levels are high, you are over 50, or you are at high risk for heart disease, you may need your cholesterol levels checked more frequently. Ongoing high lipid and cholesterol levels should be treated with medicines if diet and exercise are not working.  If you smoke, find out from your health care provider how to quit. If you do not use tobacco, do not start.  Lung cancer screening is recommended for adults aged 55-80 years who are at high risk for developing lung cancer because of a history of smoking. A yearly low-dose CT scan of the lungs is recommended for people who have at least a 30-pack-year   history of smoking and are a current smoker or have quit within the past 15 years. A pack year of smoking is smoking an average of 1 pack of cigarettes a day for 1 year (for example: 1 pack a day for 30 years or 2 packs a day for 15 years). Yearly screening should continue until the smoker has stopped smoking for at least 15 years. Yearly screening should be stopped for people who develop a health problem that would prevent them from having lung cancer treatment.  If you choose to drink alcohol, do not have more than 2 drinks per day. One drink is considered to be 12 ounces (355 mL) of beer, 5 ounces (148 mL) of wine, or 1.5 ounces (44 mL) of liquor.  Avoid use of street drugs. Do not share needles with anyone. Ask for help if you need support or instructions about stopping the use of drugs.  High blood pressure causes heart disease and increases the risk of stroke. Your blood pressure should be checked at least every 1-2 years. Ongoing high blood  pressure should be treated with medicines, if weight loss and exercise are not effective.  If you are 45-79 years old, ask your health care provider if you should take aspirin to prevent heart disease.  Diabetes screening involves taking a blood sample to check your fasting blood sugar level. This should be done once every 3 years, after age 45, if you are within normal weight and without risk factors for diabetes. Testing should be considered at a younger age or be carried out more frequently if you are overweight and have at least 1 risk factor for diabetes.  Colorectal cancer can be detected and often prevented. Most routine colorectal cancer screening begins at the age of 50 and continues through age 75. However, your health care provider may recommend screening at an earlier age if you have risk factors for colon cancer. On a yearly basis, your health care provider may provide home test kits to check for hidden blood in the stool. Use of a small camera at the end of a tube to directly examine the colon (sigmoidoscopy or colonoscopy) can detect the earliest forms of colorectal cancer. Talk to your health care provider about this at age 50, when routine screening begins. Direct exam of the colon should be repeated every 5-10 years through age 75, unless early forms of precancerous polyps or small growths are found.   Talk with your health care provider about prostate cancer screening.  Testicular cancer screening isrecommended for adult males. Screening includes self-exam, a health care provider exam, and other screening tests. Consult with your health care provider about any symptoms you have or any concerns you have about testicular cancer.  Use sunscreen. Apply sunscreen liberally and repeatedly throughout the day. You should seek shade when your shadow is shorter than you. Protect yourself by wearing long sleeves, pants, a wide-brimmed hat, and sunglasses year round, whenever you are  outdoors.  Once a month, do a whole-body skin exam, using a mirror to look at the skin on your back. Tell your health care provider about new moles, moles that have irregular borders, moles that are larger than a pencil eraser, or moles that have changed in shape or color.  Stay current with required vaccines (immunizations).  Influenza vaccine. All adults should be immunized every year.  Tetanus, diphtheria, and acellular pertussis (Td, Tdap) vaccine. An adult who has not previously received Tdap or who does not know his vaccine   status should receive 1 dose of Tdap. This initial dose should be followed by tetanus and diphtheria toxoids (Td) booster doses every 10 years. Adults with an unknown or incomplete history of completing a 3-dose immunization series with Td-containing vaccines should begin or complete a primary immunization series including a Tdap dose. Adults should receive a Td booster every 10 years.  Varicella vaccine. An adult without evidence of immunity to varicella should receive 2 doses or a second dose if he has previously received 1 dose.  Human papillomavirus (HPV) vaccine. Males aged 13-21 years who have not received the vaccine previously should receive the 3-dose series. Males aged 22-26 years may be immunized. Immunization is recommended through the age of 26 years for any male who has sex with males and did not get any or all doses earlier. Immunization is recommended for any person with an immunocompromised condition through the age of 26 years if he did not get any or all doses earlier. During the 3-dose series, the second dose should be obtained 4-8 weeks after the first dose. The third dose should be obtained 24 weeks after the first dose and 16 weeks after the second dose.  Zoster vaccine. One dose is recommended for adults aged 60 years or older unless certain conditions are present.    PREVNAR  - Pneumococcal 13-valent conjugate (PCV13) vaccine. When indicated, a  person who is uncertain of his immunization history and has no record of immunization should receive the PCV13 vaccine. An adult aged 19 years or older who has certain medical conditions and has not been previously immunized should receive 1 dose of PCV13 vaccine. This PCV13 should be followed with a dose of pneumococcal polysaccharide (PPSV23) vaccine. The PPSV23 vaccine dose should be obtained at least 1 r more year(s) after the dose of PCV13 vaccine. An adult aged 19 years or older who has certain medical conditions and previously received 1 or more doses of PPSV23 vaccine should receive 1 dose of PCV13. The PCV13 vaccine dose should be obtained 1 or more years after the last PPSV23 vaccine dose.    PNEUMOVAX - Pneumococcal polysaccharide (PPSV23) vaccine. When PCV13 is also indicated, PCV13 should be obtained first. All adults aged 65 years and older should be immunized. An adult younger than age 65 years who has certain medical conditions should be immunized. Any person who resides in a nursing home or long-term care facility should be immunized. An adult smoker should be immunized. People with an immunocompromised condition and certain other conditions should receive both PCV13 and PPSV23 vaccines. People with human immunodeficiency virus (HIV) infection should be immunized as soon as possible after diagnosis. Immunization during chemotherapy or radiation therapy should be avoided. Routine use of PPSV23 vaccine is not recommended for American Indians, [place] Natives, or people younger than 65 years unless there are medical conditions that require PPSV23 vaccine. When indicated, people who have unknown immunization and have no record of immunization should receive PPSV23 vaccine. One-time revaccination 5 years after the first dose of PPSV23 is recommended for people aged 19-64 years who have chronic kidney failure, nephrotic syndrome, asplenia, or immunocompromised conditions. People who received 1-2 doses  of PPSV23 before age 65 years should receive another dose of PPSV23 vaccine at age 65 years or later if at least 5 years have passed since the previous dose. Doses of PPSV23 are not needed for people immunized with PPSV23 at or after age 65 years.    Hepatitis A vaccine. Adults who wish to be   protected from this disease, have certain high-risk conditions, work with hepatitis A-infected animals, work in hepatitis A research labs, or travel to or work in countries with a high rate of hepatitis A should be immunized. Adults who were previously unvaccinated and who anticipate close contact with an international adoptee during the first 60 days after arrival in the [place] from a country with a high rate of hepatitis A should be immunized.    Hepatitis B vaccine. Adults should be immunized if they wish to be protected from this disease, have certain high-risk conditions, may be exposed to blood or other infectious body fluids, are household contacts or sex partners of hepatitis B positive people, are clients or workers in certain care facilities, or travel to or work in countries with a high rate of hepatitis B.   Preventive Service / Frequency   Ages 40 to 64  Blood pressure check.  Lipid and cholesterol check  Lung cancer screening. / Every year if you are aged 55-80 years and have a 30-pack-year history of smoking and currently smoke or have quit within the past 15 years. Yearly screening is stopped once you have quit smoking for at least 15 years or develop a health problem that would prevent you from having lung cancer treatment.  Fecal occult blood test (FOBT) of stool. / Every year beginning at age 50 and continuing until age 75. You may not have to do this test if you get a colonoscopy every 10 years.  Flexible sigmoidoscopy** or colonoscopy.** / Every 5 years for a flexible sigmoidoscopy or every 10 years for a colonoscopy beginning at age 50 and continuing until age 75. Screening  for abdominal aortic aneurysm (AAA)  by ultrasound is recommended for people who have history of high blood pressure or who are current or former smokers. +++++++++++ Recommend Adult Low Dose Aspirin or  coated  Aspirin 81 mg daily  To reduce risk of Colon Cancer 40 %,  Skin Cancer 26 % ,  Malignant Melanoma 46%  and  Pancreatic cancer 60% ++++++++++++++++++++ Vitamin D goal  is between 70-100.  Please make sure that you are taking your Vitamin D as directed.  It is very important as a natural anti-inflammatory  helping hair, skin, and nails, as well as reducing stroke and heart attack risk.  It helps your bones and helps with mood. It also decreases numerous cancer risks so please take it as directed.  Low Vit D is associated with a 200-300% higher risk for CANCER  and 200-300% higher risk for HEART   ATTACK  &  STROKE.   ...................................... It is also associated with higher death rate at younger ages,  autoimmune diseases like Rheumatoid arthritis, Lupus, Multiple Sclerosis.    Also many other serious conditions, like depression, Alzheimer's Dementia, infertility, muscle aches, fatigue, fibromyalgia - just to name a few. +++++++++++++++++++++ Recommend the book "The END of DIETING" by Dr Joel Fuhrman  & the book "The END of DIABETES " by Dr Joel Fuhrman At [employer].com - get book & Audio CD's    Being diabetic has a  300% increased risk for heart attack, stroke, cancer, and alzheimer- type vascular dementia. It is very important that you work harder with diet by avoiding all foods that are white. Avoid white rice (brown & wild rice is OK), white potatoes (sweetpotatoes in moderation is OK), White bread or wheat bread or anything made out of white flour like bagels, donuts, rolls, buns, biscuits, cakes, pastries, cookies, pizza   crust, and pasta (made from white flour & egg whites) - vegetarian pasta or spinach or wheat pasta is OK. Multigrain breads like Arnold's or  [employer], or multigrain sandwich thins or flatbreads.  Diet, exercise and weight loss can reverse and cure diabetes in the early stages.  Diet, exercise and weight loss is very important in the control and prevention of complications of diabetes which affects every system in your body, ie. Brain - dementia/stroke, eyes - glaucoma/blindness, heart - heart attack/heart failure, kidneys - dialysis, stomach - gastric paralysis, intestines - malabsorption, nerves - severe painful neuritis, circulation - gangrene & loss of a leg(s), and finally cancer and Alzheimers.    I recommend avoid fried & greasy foods,  sweets/candy, white rice (brown or wild rice or Quinoa is OK), white potatoes (sweet potatoes are OK) - anything made from white flour - bagels, doughnuts, rolls, buns, biscuits,white and wheat breads, pizza crust and traditional pasta made of white flour & egg white(vegetarian pasta or spinach or wheat pasta is OK).  Multi-grain bread is OK - like multi-grain flat bread or sandwich thins. Avoid alcohol in excess. Exercise is also important.    Eat all the vegetables you want - avoid meat, especially red meat and dairy - especially cheese.  Cheese is the most concentrated form of trans-fats which is the worst thing to clog up our arteries. Veggie cheese is OK which can be found in the fresh produce section at [employer] or [employer] or [employer]  ++++++++++++++++++++++ DASH Eating Plan  DASH stands for "Dietary Approaches to Stop Hypertension."   The DASH eating plan is a healthy eating plan that has been shown to reduce high blood pressure (hypertension). Additional health benefits may include reducing the risk of type 2 diabetes mellitus, heart disease, and stroke. The DASH eating plan may also help with weight loss. WHAT DO I NEED TO KNOW ABOUT THE DASH EATING PLAN? For the DASH eating plan, you will follow these general guidelines:  Choose foods with a percent daily value for sodium of  less than 5% (as listed on the food label).  Use salt-free seasonings or herbs instead of table salt or sea salt.  Check with your health care provider or pharmacist before using salt substitutes.  Eat lower-sodium products, often labeled as "lower sodium" or "no salt added."  Eat fresh foods.  Eat more vegetables, fruits, and low-fat dairy products.  Choose whole grains. Look for the word "whole" as the first word in the ingredient list.  Choose fish   Limit sweets, desserts, sugars, and sugary drinks.  Choose heart-healthy fats.  Eat veggie cheese   Eat more home-cooked food and less restaurant, buffet, and fast food.  Limit fried foods.  Cook foods using methods other than frying.  Limit canned vegetables. If you do use them, rinse them well to decrease the sodium.  When eating at a restaurant, ask that your food be prepared with less salt, or no salt if possible.                      WHAT FOODS CAN I EAT? Read Dr Joel Fuhrman's books on The End of Dieting & The End of Diabetes  Grains Whole grain or whole wheat bread. Brown rice. Whole grain or whole wheat pasta. Quinoa, bulgur, and whole grain cereals. Low-sodium cereals. Corn or whole wheat flour tortillas. Whole grain cornbread. Whole grain crackers. Low-sodium crackers.  Vegetables Fresh or frozen vegetables (raw, steamed, roasted,   or grilled). Low-sodium or reduced-sodium tomato and vegetable juices. Low-sodium or reduced-sodium tomato sauce and paste. Low-sodium or reduced-sodium canned vegetables.   Fruits All fresh, canned (in natural juice), or frozen fruits.  Protein Products  All fish and seafood.  Dried beans, peas, or lentils. Unsalted nuts and seeds. Unsalted canned beans.  Dairy Low-fat dairy products, such as skim or 1% milk, 2% or reduced-fat cheeses, low-fat ricotta or cottage cheese, or plain low-fat yogurt. Low-sodium or reduced-sodium cheeses.  Fats and Oils Tub margarines without trans  fats. Light or reduced-fat mayonnaise and salad dressings (reduced sodium). Avocado. Safflower, olive, or canola oils. Natural peanut or almond butter.  Other Unsalted popcorn and pretzels. The items listed above may not be a complete list of recommended foods or beverages. Contact your dietitian for more options.  +++++++++++++++++++  WHAT FOODS ARE NOT RECOMMENDED? Grains/ White flour or wheat flour White bread. White pasta. White rice. Refined cornbread. Bagels and croissants. Crackers that contain trans fat.  Vegetables  Creamed or fried vegetables. Vegetables in a . Regular canned vegetables. Regular canned tomato sauce and paste. Regular tomato and vegetable juices.  Fruits Dried fruits. Canned fruit in light or heavy syrup. Fruit juice.  Meat and Other Protein Products Meat in general - RED meat & White meat.  Fatty cuts of meat. Ribs, chicken wings, all processed meats as bacon, sausage, bologna, salami, fatback, hot dogs, bratwurst and packaged luncheon meats.  Dairy Whole or 2% milk, cream, half-and-half, and cream cheese. Whole-fat or sweetened yogurt. Full-fat cheeses or blue cheese. Non-dairy creamers and whipped toppings. Processed cheese, cheese spreads, or cheese curds.  Condiments Onion and garlic salt, seasoned salt, table salt, and sea salt. Canned and packaged gravies. Worcestershire sauce. Tartar sauce. Barbecue sauce. Teriyaki sauce. Soy sauce, including reduced sodium. Steak sauce. Fish sauce. Oyster sauce. Cocktail sauce. Horseradish. Ketchup and mustard. Meat flavorings and tenderizers. Bouillon cubes. Hot sauce. Tabasco sauce. Marinades. Taco seasonings. Relishes.  Fats and Oils Butter, stick margarine, lard, shortening and bacon fat. Coconut, palm kernel, or palm oils. Regular salad dressings.  Pickles and olives. Salted popcorn and pretzels.  The items listed above may not be a complete list of foods and beverages to avoid.    
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
## 2019-12-11 LAB — COMPLETE METABOLIC PANEL WITH GFR
AG Ratio: 2.5 (calc) (ref 1.0–2.5)
ALT: 29 U/L (ref 9–46)
AST: 17 U/L (ref 10–35)
Albumin: 4.9 g/dL (ref 3.6–5.1)
Alkaline phosphatase (APISO): 95 U/L (ref 35–144)
BUN: 13 mg/dL (ref 7–25)
CO2: 32 mmol/L (ref 20–32)
Calcium: 9.5 mg/dL (ref 8.6–10.3)
Chloride: 103 mmol/L (ref 98–110)
Creat: 1.07 mg/dL (ref 0.70–1.25)
GFR, Est African American: 86 mL/min/{1.73_m2} (ref >=60)
GFR, Est Non African American: 75 mL/min/{1.73_m2} (ref >=60)
Globulin: 2 g/dL (calc) (ref 1.9–3.7)
Glucose, Bld: 102 mg/dL — ABNORMAL HIGH (ref 65–99)
Potassium: 4.1 mmol/L (ref 3.5–5.3)
Sodium: 140 mmol/L (ref 135–146)
Total Bilirubin: 0.5 mg/dL (ref 0.2–1.2)
Total Protein: 6.9 g/dL (ref 6.1–8.1)

## 2019-12-11 LAB — URINALYSIS, ROUTINE W REFLEX MICROSCOPIC
Bacteria, UA: NONE SEEN /HPF
Bilirubin Urine: NEGATIVE
Glucose, UA: NEGATIVE
Hgb urine dipstick: NEGATIVE
Hyaline Cast: NONE SEEN /LPF
Ketones, ur: NEGATIVE
Leukocytes,Ua: NEGATIVE
Nitrite: NEGATIVE
RBC / HPF: NONE SEEN /HPF (ref 0–2)
Specific Gravity, Urine: 1.019 (ref 1.001–1.03)
Squamous Epithelial / HPF: NONE SEEN /HPF (ref <=5)
WBC, UA: NONE SEEN /HPF (ref 0–5)
pH: 7.5 (ref 5.0–8.0)

## 2019-12-11 LAB — CBC WITH DIFFERENTIAL/PLATELET
Absolute Monocytes: 279 cells/uL (ref 200–950)
Basophils Absolute: 39 cells/uL (ref 0–200)
Basophils Relative: 0.8 %
Eosinophils Absolute: 88 cells/uL (ref 15–500)
Eosinophils Relative: 1.8 %
HCT: 47 % (ref 38.5–50.0)
Hemoglobin: 16.1 g/dL (ref 13.2–17.1)
Lymphs Abs: 1744 cells/uL (ref 850–3900)
MCH: 29 pg (ref 27.0–33.0)
MCHC: 34.3 g/dL (ref 32.0–36.0)
MCV: 84.5 fL (ref 80.0–100.0)
MPV: 10 fL (ref 7.5–12.5)
Monocytes Relative: 5.7 %
Neutro Abs: 2749 cells/uL (ref 1500–7800)
Neutrophils Relative %: 56.1 %
Platelets: 211 10*3/uL (ref 140–400)
RBC: 5.56 10*6/uL (ref 4.20–5.80)
RDW: 12.8 % (ref 11.0–15.0)
Total Lymphocyte: 35.6 %
WBC: 4.9 10*3/uL (ref 3.8–10.8)

## 2019-12-11 LAB — HEMOGLOBIN A1C
Hgb A1c MFr Bld: 5.6 % of total Hgb (ref <5.7)
Mean Plasma Glucose: 114 (calc)
eAG (mmol/L): 6.3 (calc)

## 2019-12-11 LAB — INSULIN, RANDOM: Insulin: 131.4 u[IU]/mL — ABNORMAL HIGH

## 2019-12-11 LAB — VITAMIN D 25 HYDROXY (VIT D DEFICIENCY, FRACTURES): Vit D, 25-Hydroxy: 54 ng/mL (ref 30–100)

## 2019-12-11 LAB — LIPID PANEL
Cholesterol: 147 mg/dL (ref <200)
HDL: 46 mg/dL (ref >=40)
LDL Cholesterol (Calc): 78 mg/dL (calc)
Non-HDL Cholesterol (Calc): 101 mg/dL (calc) (ref <130)
Total CHOL/HDL Ratio: 3.2 (calc) (ref <5.0)
Triglycerides: 136 mg/dL (ref <150)

## 2019-12-11 LAB — MICROALBUMIN / CREATININE URINE RATIO
Creatinine, Urine: 137 mg/dL (ref 20–320)
Microalb Creat Ratio: 118 mcg/mg creat — ABNORMAL HIGH (ref <30)
Microalb, Ur: 16.1 mg/dL

## 2019-12-11 LAB — VITAMIN B12: Vitamin B-12: 555 pg/mL (ref 200–1100)

## 2019-12-11 LAB — MAGNESIUM: Magnesium: 2.2 mg/dL (ref 1.5–2.5)

## 2019-12-11 LAB — IRON, TOTAL/TOTAL IRON BINDING CAP
%SAT: 30 % (calc) (ref 20–48)
Iron: 99 ug/dL (ref 50–180)
TIBC: 335 mcg/dL (calc) (ref 250–425)

## 2019-12-11 LAB — TSH: TSH: 1.71 mIU/L (ref 0.40–4.50)

## 2019-12-11 LAB — PSA: PSA: 1.2 ng/mL (ref <=4.0)

## 2019-12-11 LAB — TESTOSTERONE: Testosterone: 444 ng/dL (ref 250–827)

## 2019-12-12 ENCOUNTER — Encounter: Payer: Self-pay | Admitting: Internal Medicine

## 2019-12-14 LAB — TB SKIN TEST
Induration: 0 mm
TB Skin Test: NEGATIVE

## 2019-12-18 ENCOUNTER — Ambulatory Visit: Payer: Self-pay | Attending: Internal Medicine

## 2019-12-18 DIAGNOSIS — Z23 Encounter for immunization: Secondary | ICD-10-CM | POA: Insufficient documentation

## 2019-12-18 NOTE — Progress Notes (Signed)
   Covid-19 Vaccination Clinic  Name:  Gerald [name]    MRN: [id number] DOB: May 28, 1956  12/18/2019  Gerald [name] was observed post Covid-19 immunization for 15 minutes without incidence. He was provided with Vaccine Information Sheet and instruction to access the V-Safe system.   Gerald [name] was instructed to call 911 with any severe reactions post vaccine: Marland Kitchen Difficulty breathing  . Swelling of your face and throat  . A fast heartbeat  . A bad rash all over your body  . Dizziness and weakness    Immunizations Administered    Name Date Dose VIS Date Route   Pfizer COVID-19 Vaccine 12/18/2019 12:14 PM 0.3 mL 10/09/2019 Intramuscular   Manufacturer: [employer], [employer]   Lot: [id number]   NDC: [address]-7

## 2020-01-12 ENCOUNTER — Ambulatory Visit: Payer: Self-pay | Attending: Internal Medicine

## 2020-01-12 DIAGNOSIS — Z23 Encounter for immunization: Secondary | ICD-10-CM

## 2020-01-12 NOTE — Progress Notes (Signed)
   Covid-19 Vaccination Clinic  Name:  TERRI MALERBA    MRN: [id number] DOB: Mar 02, 1956  01/12/2020  Mr. Journey was observed post Covid-19 immunization for 15 minutes without incident. He was provided with Vaccine Information Sheet and instruction to access the V-Safe system.   Mr. Alligood was instructed to call 911 with any severe reactions post vaccine: Marland Kitchen Difficulty breathing  . Swelling of face and throat  . A fast heartbeat  . A bad rash all over body  . Dizziness and weakness   Immunizations Administered    Name Date Dose VIS Date Route   Pfizer COVID-19 Vaccine 01/12/2020  1:34 PM 0.3 mL 10/09/2019 Intramuscular   Manufacturer: [employer], [employer]   Lot: [id number]   NDC: [address]-9

## 2020-03-09 NOTE — Progress Notes (Deleted)
Assessment and Plan:   Hypertension:  -Continue medication,  -monitor blood pressure at home.  -Continue DASH diet.   -Reminder to go to the ER if any CP, SOB, nausea, dizziness, severe HA, changes vision/speech, left arm numbness and tingling, and jaw pain.  Cholesterol: -cont crestor -at goal -Continue diet and exercise.  -Check cholesterol.   Abnormal glucose -Continue diet and exercise.   Vitamin D Def: -continue medications.   Hypothyroidism -cont levothyroxine -TSH -dose adjust if necessary   Continue diet and meds as discussed. Further disposition pending results of labs. Future Appointments  Date Time Provider [place]  03/10/2020  8:45 AM Liane Comber, NP [facility] None  06/23/2020  9:30 AM Unk Pinto, MD [facility] None  12/20/2020 10:00 AM Unk Pinto, MD [facility] None    HPI 64 y.o. male  presents for 3 month follow up with hypertension, hyperlipidemia, prediabetes and vitamin D.  Has 11 grandkids, 11th due in Nov little girl, 90 month old and 89 year old, holly's kids come and stay with them. Working half days. ***  BMI is There is no height or weight on file to calculate BMI., he {HAS HAS [id number]} been working on diet and exercise. Wt Readings from Last 3 Encounters:  12/10/19 213 lb 3.2 oz (96.7 kg)  09/03/19 211 lb (95.7 kg)  06/03/19 218 lb 12.8 oz (99.2 kg)    His blood pressure has been controlled at home,  enalapril to 1/2 pill and he is on propanolol ER for tremor, today their BP is  .    He does workout. He denies chest pain, shortness of breath, dizziness.     He is on cholesterol medication, crestor 20 mg and denies myalgias. His cholesterol is at goal. The cholesterol last visit was:   Lab Results  Component Value Date   CHOL 147 12/10/2019   HDL 46 12/10/2019   LDLCALC 78 12/10/2019   TRIG 136 12/10/2019   CHOLHDL 3.2 12/10/2019    He has been working on diet and exercise for hx of prediabetes (A1C 5.7% in  2015), and denies foot ulcerations, hyperglycemia, hypoglycemia , increased appetite, nausea, paresthesia of the feet, polydipsia, polyuria, visual disturbances, vomiting and weight loss. Last A1C in the office was:  Lab Results  Component Value Date   HGBA1C 5.6 12/10/2019   Patient is on Vitamin D supplement, was advised to increase from 5000 IU to 10000 IU at last visit ***  Lab Results  Component Value Date   VD25OH 88 12/10/2019     He is on thyroid medication. His medication was not changed last visit.   Lab Results  Component Value Date   TSH 1.71 12/10/2019  .    Current Medications:  Current Outpatient Medications on File Prior to Visit  Medication Sig Dispense Refill  . aspirin 81 MG tablet Take 81 mg by mouth daily.    . Cholecalciferol (VITAMIN D PO) Take 5,000 Units by mouth daily.    Marland Kitchen DIAZEPAM PO Take by mouth as needed.    . enalapril (VASOTEC) 20 MG tablet Take 21 tablet Daily for BP 90 tablet 3  . levothyroxine (EUTHYROX) 50 MCG tablet Take 1 tablet daily on an empty stomach with only water for 30 minutes & no Antacid meds, Calcium or Magnesium for 4 hours & avoid Biotin 90 tablet 3  . Magnesium 250 MG TABS Take 250 mg by mouth daily.    . Multiple Vitamins-Minerals (MULTIVITAMIN PO) Take by mouth.    Marland Kitchen  propranolol ER (INDERAL LA) 120 MG 24 hr capsule Take 1 capsule Daily for BP & Tremor 90 capsule 3  . rosuvastatin (CRESTOR) 20 MG tablet Take 1 tablet daily for cholesterol. 90 tablet 0   No current facility-administered medications on file prior to visit.    Medical History:  Past Medical History:  Diagnosis Date  . Hyperlipidemia   . Hypertension   . LBBB (left bundle branch block)   . Prediabetes   . Thyroid disease    HYPO  . Varicose veins   . Vitamin D deficiency     Allergies:  Allergies  Allergen Reactions  . Simvastatin   . Iodine Rash     Review of Systems:  Review of Systems  Constitutional: Negative for chills, fever and  malaise/fatigue.  HENT: Negative for congestion, ear pain and sore throat.   Eyes: Negative.   Respiratory: Negative for cough, shortness of breath and wheezing.   Cardiovascular: Negative for chest pain, palpitations and leg swelling.  Gastrointestinal: Negative for abdominal pain, blood in stool, constipation, diarrhea, heartburn and melena.  Genitourinary: Negative.   Skin: Negative.   Neurological: Negative for dizziness, sensory change, loss of consciousness and headaches.  Psychiatric/Behavioral: Negative for depression. The patient is not nervous/anxious and does not have insomnia.     Family history- Review and unchanged  Social history- Review and unchanged  Physical Exam: There were no vitals taken for this visit. Wt Readings from Last 3 Encounters:  12/10/19 213 lb 3.2 oz (96.7 kg)  09/03/19 211 lb (95.7 kg)  06/03/19 218 lb 12.8 oz (99.2 kg)    General Appearance: Well nourished well developed, in no apparent distress. Eyes: PERRLA, EOMs, conjunctiva no swelling or erythema ENT/Mouth: Ear canals normal without obstruction, swelling, erythma, discharge.  TMs normal bilaterally.  Oropharynx moist, clear, without exudate, or postoropharyngeal swelling. Neck: Supple, thyroid normal,no cervical adenopathy  Respiratory: Respiratory effort normal, Breath sounds clear A&P without rhonchi, wheeze, or rale.  No retractions, no accessory usage. Cardio: RRR with no MRGs. Brisk peripheral pulses without edema.  Abdomen: Soft, + BS,  Non tender, no guarding, rebound, hernias, masses. Musculoskeletal: Full ROM, 5/5 strength, Normal gait Skin: Warm, dry without rashes, lesions, ecchymosis.  Neuro: Awake and oriented X 3, Cranial nerves intact. Normal muscle tone, no cerebellar symptoms. Intention tremor and slight head tremor.  Psych: Normal affect, Insight and Judgment appropriate.    [name], NP 8:17 AM [facility]

## 2020-03-10 ENCOUNTER — Ambulatory Visit: Payer: 59 | Admitting: Adult Health

## 2020-03-29 NOTE — Progress Notes (Signed)
Assessment and Plan:   Hypertension:  -Continue medication,  -monitor blood pressure at home.  -Continue DASH diet.   -Reminder to go to the ER if any CP, SOB, nausea, dizziness, severe HA, changes vision/speech, left arm numbness and tingling, and jaw pain.  Cholesterol: -cont crestor -at goal -Continue diet and exercise.  -Check cholesterol.   Abnormal glucose -Continue diet and exercise.   Vitamin D Def: -continue medications.   Hypothyroidism -cont levothyroxine -TSH -dose adjust if necessary  Actinic keratoses Several extensive areas, has ? Done fluorouracil Advised schedule appointment for cryo or follow up derm  Continue diet and meds as discussed. Further disposition pending results of labs. Future Appointments  Date Time Provider [place]  06/23/2020  9:30 AM Unk Pinto, MD [facility] None  12/20/2020 10:00 AM Unk Pinto, MD [facility] None    HPI 64 y.o. male  presents for 3 month follow up with hypertension, hyperlipidemia, prediabetes and vitamin D.  Has 11 grandkids, 11th due in Nov little girl, 66 month old and 70 year old, holly's kids come and stay with them. Back to working full days at [employer].   BMI is Body mass index is 26.48 kg/m., he has been working on diet and exercise.  Watching portions, walks daily 15-20 min.  Has cut out alcohol since Feb 2021, 7 per week.  Wt Readings from Last 3 Encounters:  03/30/20 209 lb (94.8 kg)  12/10/19 213 lb 3.2 oz (96.7 kg)  09/03/19 211 lb (95.7 kg)    His blood pressure has been controlled at home,  Enalapril 20 mg daily in AM and he is on propanolol ER for tremor, today their BP is BP: 122/68.    He does workout. He denies chest pain, shortness of breath, dizziness.     He is on cholesterol medication, crestor 20 mg and denies myalgias. His cholesterol is at goal. The cholesterol last visit was:   Lab Results  Component Value Date   CHOL 147 12/10/2019   HDL 46  12/10/2019   LDLCALC 78 12/10/2019   TRIG 136 12/10/2019   CHOLHDL 3.2 12/10/2019    He has been working on diet and exercise for hx of prediabetes (A1C 5.7% in 2015), and denies foot ulcerations, hyperglycemia, hypoglycemia , increased appetite, nausea, paresthesia of the feet, polydipsia, polyuria, visual disturbances, vomiting and weight loss.  Last A1C in the office was:  Lab Results  Component Value Date   HGBA1C 5.6 12/10/2019   Patient is on Vitamin D supplement, was advised to increase from 5000 IU to 10000 IU at last visit but didn't do so Lab Results  Component Value Date   VD25OH 13 12/10/2019     He is on thyroid medication. His medication was not changed last visit.   Lab Results  Component Value Date   TSH 1.71 12/10/2019     Current Medications:  Current Outpatient Medications on File Prior to Visit  Medication Sig Dispense Refill  . aspirin 81 MG tablet Take 81 mg by mouth daily.    . Cholecalciferol (VITAMIN D PO) Take 5,000 Units by mouth daily.    Marland Kitchen DIAZEPAM PO Take by mouth as needed.    . enalapril (VASOTEC) 20 MG tablet Take 21 tablet Daily for BP 90 tablet 3  . levothyroxine (EUTHYROX) 50 MCG tablet Take 1 tablet daily on an empty stomach with only water for 30 minutes & no Antacid meds, Calcium or Magnesium for 4 hours & avoid Biotin 90 tablet  3  . Magnesium 250 MG TABS Take 250 mg by mouth daily.    . Multiple Vitamins-Minerals (MULTIVITAMIN PO) Take by mouth.    . propranolol ER (INDERAL LA) 120 MG 24 hr capsule Take 1 capsule Daily for BP & Tremor 90 capsule 3  . rosuvastatin (CRESTOR) 20 MG tablet Take 1 tablet daily for cholesterol. 90 tablet 0   No current facility-administered medications on file prior to visit.    Medical History:  Past Medical History:  Diagnosis Date  . Hyperlipidemia   . Hypertension   . LBBB (left bundle branch block)   . Prediabetes   . Thyroid disease    HYPO  . Varicose veins   . Vitamin D deficiency      Allergies:  Allergies  Allergen Reactions  . Simvastatin   . Iodine Rash     Review of Systems:  Review of Systems  Constitutional: Negative for chills, fever and malaise/fatigue.  HENT: Negative for congestion, ear pain and sore throat.   Eyes: Negative.   Respiratory: Negative for cough, shortness of breath and wheezing.   Cardiovascular: Negative for chest pain, palpitations and leg swelling.  Gastrointestinal: Negative for abdominal pain, blood in stool, constipation, diarrhea, heartburn and melena.  Genitourinary: Negative.   Skin: Negative.   Neurological: Negative for dizziness, sensory change, loss of consciousness and headaches.  Psychiatric/Behavioral: Negative for depression. The patient is not nervous/anxious and does not have insomnia.     Family history- Review and unchanged  Social history- Review and unchanged  Physical Exam: BP 122/68   Pulse (!) 55   Temp (!) 97.3 F (36.3 C)   Ht 6' 2.5" (1.892 m)   Wt 209 lb (94.8 kg)   SpO2 96%   BMI 26.48 kg/m  Wt Readings from Last 3 Encounters:  03/30/20 209 lb (94.8 kg)  12/10/19 213 lb 3.2 oz (96.7 kg)  09/03/19 211 lb (95.7 kg)    General Appearance: Well nourished well developed, in no apparent distress. Eyes: PERRLA, EOMs, conjunctiva no swelling or erythema ENT/Mouth: Ear canals normal without obstruction, swelling, erythma, discharge.  TMs normal bilaterally.  Oropharynx moist, clear, without exudate, or postoropharyngeal swelling. Neck: Supple, thyroid normal,no cervical adenopathy  Respiratory: Respiratory effort normal, Breath sounds clear A&P without rhonchi, wheeze, or rale.  No retractions, no accessory usage. Cardio: RRR with no MRGs. Brisk peripheral pulses without edema.  Abdomen: Soft, + BS,  Non tender, no guarding, rebound, hernias, masses. Musculoskeletal: Full ROM, 5/5 strength, Normal gait Skin: Warm, dry without rashes, ecchymosis. He has several areas to face with erythematous  base, crusty top, suspect for possible actinic keratoses Neuro: Awake and oriented X 3, Cranial nerves intact. Normal muscle tone, no cerebellar symptoms. Intention tremor and slight head tremor.  Psych: Normal affect, Insight and Judgment appropriate.    [name], NP 3:53 PM [facility]

## 2020-03-30 ENCOUNTER — Other Ambulatory Visit: Payer: Self-pay

## 2020-03-30 ENCOUNTER — Ambulatory Visit (INDEPENDENT_AMBULATORY_CARE_PROVIDER_SITE_OTHER): Payer: 59 | Admitting: Adult Health

## 2020-03-30 ENCOUNTER — Encounter: Payer: Self-pay | Admitting: Adult Health

## 2020-03-30 VITALS — BP 122/68 | HR 55 | Temp 97.3°F | Ht 74.5 in | Wt 209.0 lb

## 2020-03-30 DIAGNOSIS — I10 Essential (primary) hypertension: Secondary | ICD-10-CM

## 2020-03-30 DIAGNOSIS — E559 Vitamin D deficiency, unspecified: Secondary | ICD-10-CM

## 2020-03-30 DIAGNOSIS — E782 Mixed hyperlipidemia: Secondary | ICD-10-CM

## 2020-03-30 DIAGNOSIS — R251 Tremor, unspecified: Secondary | ICD-10-CM

## 2020-03-30 DIAGNOSIS — Z79899 Other long term (current) drug therapy: Secondary | ICD-10-CM

## 2020-03-30 DIAGNOSIS — E039 Hypothyroidism, unspecified: Secondary | ICD-10-CM | POA: Diagnosis not present

## 2020-03-30 DIAGNOSIS — Z6827 Body mass index (BMI) 27.0-27.9, adult: Secondary | ICD-10-CM

## 2020-03-30 MED ORDER — ROSUVASTATIN CALCIUM 20 MG PO TABS: ORAL_TABLET | ORAL | 3 refills | Status: DC

## 2020-03-30 MED ORDER — ENALAPRIL MALEATE 20 MG PO TABS: ORAL_TABLET | ORAL | 3 refills | Status: DC

## 2020-03-30 NOTE — Patient Instructions (Addendum)
Goals     Exercise 150 min/wk Moderate Activity     Weight (lb) < 200 lb (90.7 kg)     End goal 190-195lb      Please get record for last colonoscopy   Recommend schedule an appointment with derm for possible precancerous skin lesions    Actinic Keratosis An actinic keratosis is a precancerous growth on the skin. If there is more than one growth, the condition is called actinic keratoses. Actinic keratoses appear most often on areas of skin that get a lot of sun exposure, including the scalp, face, ears, lips, upper back, forearms, and the backs of the hands. If left untreated, these growths may develop into a skin cancer called squamous cell carcinoma. It is important to have all these growths checked by a health care provider to determine the best treatment approach. What are the causes? Actinic keratoses are caused by getting too much ultraviolet (UV) radiation from the sun or other UV light sources. What increases the risk? You are more likely to develop this condition if you:  Have light-colored skin and blue eyes.  Have blond or red hair.  Spend a lot of time in the sun.  Do not protect your skin from the sun when outdoors.  Are an older person. The risk of developing an actinic keratosis increases with age. What are the signs or symptoms? Actinic keratoses feel like scaly, rough spots of skin. Symptoms of this condition include growths that may:  Be as small as a pinhead or as big as a quarter.  Itch, hurt, or feel sensitive.  Be skin-colored, light tan, dark tan, pink, or a combination of any of these colors. In most cases, the growths become red.  Have a small piece of pink or gray skin (skin tag) growing from them. It may be easier to notice actinic keratoses by feeling them, rather than seeing them. Sometimes, actinic keratoses disappear, but many reappear a few days to a few weeks later. How is this diagnosed? This condition is usually diagnosed with a physical  exam.  A tissue sample may be removed from the actinic keratosis and examined under a microscope (biopsy). How is this treated? If needed, this condition may be treated by:  Scraping off the actinic keratosis (curettage).  Freezing the actinic keratosis with liquid nitrogen (cryosurgery). This causes the growth to eventually fall off the skin.  Applying medicated creams or gels to destroy the cells in the growth.  Applying chemicals to the actinic keratosis to make the outer layers of skin peel off (chemical peel).  Using photodynamic therapy. In this procedure, medicated cream is applied to the actinic keratosis. This cream increases your skin's sensitivity to light. Then, a strong light is aimed at the actinic keratosis to destroy cells in the growth. Follow these instructions at home: Skin care  Apply cool, wet cloths (cool compresses) to the affected areas.  Do not scratch your skin.  Check your skin regularly for any growths, especially growths that: ? Start to itch or bleed. ? Change in size, shape, or color. Caring for the treated area  Keep the treated area clean and dry as told by your health care provider.  Do not apply any medicine, cream, or lotion to the treated area unless your health care provider tells you to do that.  Do not pick at blisters or try to break them open. This can cause infection and scarring.  If you have red or irritated skin after treatment, follow  instructions from your health care provider about how to take care of the treated area. Make sure you: ? Wash your hands with soap and water before you change your bandage (dressing). If soap and water are not available, use hand sanitizer. ? Change your dressing as told by your health care provider.  If you have red or irritated skin after treatment, check your treated area every day for signs of infection. Check for: ? Redness, swelling, or pain. ? Fluid or blood. ? Warmth. ? Pus or a bad  smell. General instructions  Take or apply over-the-counter and prescription medicines only as told by your health care provider.  Return to your normal activities as told by your health care provider. Ask your health care provider what activities are safe for you.  Have a skin exam done every year by a health care provider who is a skin specialist (dermatologist).  Keep all follow-up visits as told by your health care provider. This is important. Lifestyle  Do not use any products that contain nicotine or tobacco, such as cigarettes and e-cigarettes. If you need help quitting, ask your health care provider.  Take steps to protect your skin from the sun. ? Try to avoid the sun between 10:00 a.m. and 4:00 p.m. This is when the UV light is the strongest. ? Use a sunscreen or sunblock with SPF 30 (sun protection factor 30) or greater. ? Apply sunscreen before you are exposed to sunlight and reapply as often as directed by the instructions on the sunscreen container. ? Always wear sunglasses that have UV protection, and always wear a hat and clothing to protect your skin from sunlight. ? When possible, avoid medicines that increase your sensitivity to sunlight. ? Do not use tanning beds or other indoor tanning devices. Contact a health care provider if:  You notice any changes or new growths on your skin.  You have swelling, pain, or more redness around your treated area.  You have fluid or blood coming from your treated area.  Your treated area feels warm to the touch.  You have pus or a bad smell coming from your treated area.  You have a fever.  You have a blister that becomes large and painful. Summary  An actinic keratosis is a precancerous growth on the skin. If there is more than one growth, the condition is called actinic keratoses. In some cases, if left untreated, these growths can develop into skin cancer.  Check your skin regularly for any growths, especially growths  that start to itch or bleed, or change in size, shape, or color.  Take steps to protect your skin from the sun.  Contact a health care provider if you notice any changes or new growths on your skin.  Keep all follow-up visits as told by your health care provider. This is important. This information is not intended to replace advice given to you by your health care provider. Make sure you discuss any questions you have with your health care provider. Document Revised: 02/25/2018 Document Reviewed: 02/25/2018 Elsevier Patient Education  2020 [employer].

## 2020-03-31 LAB — MAGNESIUM: Magnesium: 2.3 mg/dL (ref 1.5–2.5)

## 2020-03-31 LAB — COMPLETE METABOLIC PANEL WITH GFR
AG Ratio: 2 (calc) (ref 1.0–2.5)
ALT: 16 U/L (ref 9–46)
AST: 14 U/L (ref 10–35)
Albumin: 4.5 g/dL (ref 3.6–5.1)
Alkaline phosphatase (APISO): 77 U/L (ref 35–144)
BUN: 19 mg/dL (ref 7–25)
CO2: 30 mmol/L (ref 20–32)
Calcium: 9.5 mg/dL (ref 8.6–10.3)
Chloride: 104 mmol/L (ref 98–110)
Creat: 1.21 mg/dL (ref 0.70–1.25)
GFR, Est African American: 74 mL/min/{1.73_m2} (ref >=60)
GFR, Est Non African American: 64 mL/min/{1.73_m2} (ref >=60)
Globulin: 2.3 g/dL (calc) (ref 1.9–3.7)
Glucose, Bld: 94 mg/dL (ref 65–99)
Potassium: 4.3 mmol/L (ref 3.5–5.3)
Sodium: 140 mmol/L (ref 135–146)
Total Bilirubin: 0.4 mg/dL (ref 0.2–1.2)
Total Protein: 6.8 g/dL (ref 6.1–8.1)

## 2020-03-31 LAB — CBC WITH DIFFERENTIAL/PLATELET
Absolute Monocytes: 530 cells/uL (ref 200–950)
Basophils Absolute: 40 cells/uL (ref 0–200)
Basophils Relative: 0.8 %
Eosinophils Absolute: 120 cells/uL (ref 15–500)
Eosinophils Relative: 2.4 %
HCT: 46.3 % (ref 38.5–50.0)
Hemoglobin: 15.7 g/dL (ref 13.2–17.1)
Lymphs Abs: 1960 cells/uL (ref 850–3900)
MCH: 29.7 pg (ref 27.0–33.0)
MCHC: 33.9 g/dL (ref 32.0–36.0)
MCV: 87.7 fL (ref 80.0–100.0)
MPV: 10.1 fL (ref 7.5–12.5)
Monocytes Relative: 10.6 %
Neutro Abs: 2350 cells/uL (ref 1500–7800)
Neutrophils Relative %: 47 %
Platelets: 217 10*3/uL (ref 140–400)
RBC: 5.28 10*6/uL (ref 4.20–5.80)
RDW: 13.5 % (ref 11.0–15.0)
Total Lymphocyte: 39.2 %
WBC: 5 10*3/uL (ref 3.8–10.8)

## 2020-03-31 LAB — LIPID PANEL
Cholesterol: 232 mg/dL — ABNORMAL HIGH (ref <200)
HDL: 45 mg/dL (ref >=40)
LDL Cholesterol (Calc): 149 mg/dL (calc) — ABNORMAL HIGH
Non-HDL Cholesterol (Calc): 187 mg/dL (calc) — ABNORMAL HIGH (ref <130)
Total CHOL/HDL Ratio: 5.2 (calc) — ABNORMAL HIGH (ref <5.0)
Triglycerides: 250 mg/dL — ABNORMAL HIGH (ref <150)

## 2020-03-31 LAB — VITAMIN D 25 HYDROXY (VIT D DEFICIENCY, FRACTURES): Vit D, 25-Hydroxy: 45 ng/mL (ref 30–100)

## 2020-03-31 LAB — TSH: TSH: 1.25 mIU/L (ref 0.40–4.50)

## 2020-06-23 ENCOUNTER — Ambulatory Visit (INDEPENDENT_AMBULATORY_CARE_PROVIDER_SITE_OTHER): Payer: 59 | Admitting: Internal Medicine

## 2020-06-23 ENCOUNTER — Other Ambulatory Visit: Payer: Self-pay

## 2020-06-23 ENCOUNTER — Encounter: Payer: Self-pay | Admitting: Internal Medicine

## 2020-06-23 VITALS — BP 144/88 | HR 56 | Temp 97.0°F | Resp 16 | Ht 74.0 in | Wt 212.8 lb

## 2020-06-23 DIAGNOSIS — E782 Mixed hyperlipidemia: Secondary | ICD-10-CM | POA: Diagnosis not present

## 2020-06-23 DIAGNOSIS — E559 Vitamin D deficiency, unspecified: Secondary | ICD-10-CM | POA: Diagnosis not present

## 2020-06-23 DIAGNOSIS — R7309 Other abnormal glucose: Secondary | ICD-10-CM | POA: Diagnosis not present

## 2020-06-23 DIAGNOSIS — Z79899 Other long term (current) drug therapy: Secondary | ICD-10-CM

## 2020-06-23 DIAGNOSIS — I10 Essential (primary) hypertension: Secondary | ICD-10-CM

## 2020-06-23 DIAGNOSIS — E039 Hypothyroidism, unspecified: Secondary | ICD-10-CM

## 2020-06-23 MED ORDER — OLMESARTAN MEDOXOMIL 40 MG PO TABS: ORAL_TABLET | ORAL | 0 refills | Status: DC

## 2020-06-23 NOTE — Patient Instructions (Signed)
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
## 2020-06-24 LAB — CBC WITH DIFFERENTIAL/PLATELET
Absolute Monocytes: 470 cells/uL (ref 200–950)
Basophils Absolute: 38 cells/uL (ref 0–200)
Basophils Relative: 0.7 %
Eosinophils Absolute: 140 cells/uL (ref 15–500)
Eosinophils Relative: 2.6 %
HCT: 47.7 % (ref 38.5–50.0)
Hemoglobin: 16.5 g/dL (ref 13.2–17.1)
Lymphs Abs: 1987 cells/uL (ref 850–3900)
MCH: 31 pg (ref 27.0–33.0)
MCHC: 34.6 g/dL (ref 32.0–36.0)
MCV: 89.5 fL (ref 80.0–100.0)
MPV: 10.3 fL (ref 7.5–12.5)
Monocytes Relative: 8.7 %
Neutro Abs: 2765 cells/uL (ref 1500–7800)
Neutrophils Relative %: 51.2 %
Platelets: 210 10*3/uL (ref 140–400)
RBC: 5.33 10*6/uL (ref 4.20–5.80)
RDW: 13.6 % (ref 11.0–15.0)
Total Lymphocyte: 36.8 %
WBC: 5.4 10*3/uL (ref 3.8–10.8)

## 2020-06-24 LAB — COMPLETE METABOLIC PANEL WITH GFR
AG Ratio: 2.1 (calc) (ref 1.0–2.5)
ALT: 27 U/L (ref 9–46)
AST: 19 U/L (ref 10–35)
Albumin: 4.6 g/dL (ref 3.6–5.1)
Alkaline phosphatase (APISO): 85 U/L (ref 35–144)
BUN: 14 mg/dL (ref 7–25)
CO2: 32 mmol/L (ref 20–32)
Calcium: 9.7 mg/dL (ref 8.6–10.3)
Chloride: 102 mmol/L (ref 98–110)
Creat: 1.07 mg/dL (ref 0.70–1.25)
GFR, Est African American: 86 mL/min/{1.73_m2} (ref >=60)
GFR, Est Non African American: 74 mL/min/{1.73_m2} (ref >=60)
Globulin: 2.2 g/dL (calc) (ref 1.9–3.7)
Glucose, Bld: 83 mg/dL (ref 65–99)
Potassium: 4.4 mmol/L (ref 3.5–5.3)
Sodium: 140 mmol/L (ref 135–146)
Total Bilirubin: 0.4 mg/dL (ref 0.2–1.2)
Total Protein: 6.8 g/dL (ref 6.1–8.1)

## 2020-06-24 LAB — LIPID PANEL
Cholesterol: 175 mg/dL (ref <200)
HDL: 47 mg/dL (ref >=40)
LDL Cholesterol (Calc): 97 mg/dL (calc)
Non-HDL Cholesterol (Calc): 128 mg/dL (calc) (ref <130)
Total CHOL/HDL Ratio: 3.7 (calc) (ref <5.0)
Triglycerides: 225 mg/dL — ABNORMAL HIGH (ref <150)

## 2020-06-24 LAB — INSULIN, RANDOM: Insulin: 55.3 u[IU]/mL — ABNORMAL HIGH

## 2020-06-24 LAB — MAGNESIUM: Magnesium: 2.2 mg/dL (ref 1.5–2.5)

## 2020-06-24 LAB — VITAMIN D 25 HYDROXY (VIT D DEFICIENCY, FRACTURES): Vit D, 25-Hydroxy: 43 ng/mL (ref 30–100)

## 2020-06-24 LAB — HEMOGLOBIN A1C
Hgb A1c MFr Bld: 5.4 % of total Hgb (ref <5.7)
Mean Plasma Glucose: 108 (calc)
eAG (mmol/L): 6 (calc)

## 2020-06-24 LAB — TSH: TSH: 1.53 mIU/L (ref 0.40–4.50)

## 2020-06-25 NOTE — Progress Notes (Signed)
==========================================================  -    Total Chol = 175 and LDL = 97 - Excellent  ==========================================================  - But Triglycerides (   225   ) or fats in blood are too high  (goal is less than 150)    - Recommend avoid fried & greasy foods,  sweets / candy,   - Avoid white rice  (brown or wild rice or Quinoa is OK),   - Avoid white potatoes  (sweet potatoes are OK)   - Avoid anything made from white flour  - bagels, doughnuts, rolls, buns, biscuits, white and   wheat breads, pizza crust and traditional  pasta made of white flour & egg white  - (vegetarian pasta or spinach or wheat pasta is OK).    - Multi-grain bread is OK - like multi-grain flat bread or  sandwich thins.   - Avoid alcohol in excess.   - Exercise is also important. ==========================================================  -  Vitamin D = 43 - Low   - Vitamin D goal is between 70-100.   - Please INCREASE your Vitamin D 5,000 u up to 2 caps = 10,000 units  /daily   - It is very important as a natural anti-inflammatory and helping the  immune system protect against viral infections, like the Covid-19    helping hair, skin, and nails, as well as reducing stroke and  heart attack risk.   - It helps your bones and helps with mood.  - It also decreases numerous cancer risks so please take  it as directed.   - Low Vit D is associated with a 200-300% higher risk for  CANCER   and 200-300% higher risk for HEART   ATTACK  &  STROKE.    - It is also associated with higher death rate at younger ages,   autoimmune diseases like Rheumatoid arthritis, Lupus,  Multiple Sclerosis.     - Also many other serious conditions, like depression, Alzheimer's  Dementia, infertility, muscle aches, fatigue, fibromyalgia  - just to name a few.  =========================================================  All Else - CBC - Kidneys - Electrolytes - Liver -  Magnesium & Thyroid    - all  Normal / OK =======================================================-==

## 2020-08-26 ENCOUNTER — Other Ambulatory Visit: Payer: Self-pay | Admitting: Internal Medicine

## 2020-08-26 MED ORDER — LEVOTHYROXINE SODIUM 50 MCG PO TABS: ORAL_TABLET | ORAL | 3 refills | Status: DC

## 2020-08-28 ENCOUNTER — Other Ambulatory Visit: Payer: Self-pay | Admitting: Internal Medicine

## 2020-08-28 DIAGNOSIS — G250 Essential tremor: Secondary | ICD-10-CM

## 2020-08-28 DIAGNOSIS — I10 Essential (primary) hypertension: Secondary | ICD-10-CM

## 2020-09-25 ENCOUNTER — Other Ambulatory Visit: Payer: Self-pay | Admitting: Internal Medicine

## 2020-09-25 DIAGNOSIS — I10 Essential (primary) hypertension: Secondary | ICD-10-CM

## 2020-09-29 ENCOUNTER — Ambulatory Visit: Payer: 59 | Admitting: Adult Health

## 2020-09-29 NOTE — Progress Notes (Deleted)
Assessment and Plan:   Hypertension:  -Continue medication,  -monitor blood pressure at home.  -Continue DASH diet.   -Reminder to go to the ER if any CP, SOB, nausea, dizziness, severe HA, changes vision/speech, left arm numbness and tingling, and jaw pain.  Cholesterol: -cont crestor -at goal -Continue diet and exercise.  -Check cholesterol.   Abnormal glucose -Continue diet and exercise.   Vitamin D Def: -continue medications.   Hypothyroidism -cont levothyroxine -TSH -dose adjust if necessary  Actinic keratoses Several extensive areas, has ? Done fluorouracil Advised schedule appointment for cryo or follow up derm  Continue diet and meds as discussed. Further disposition pending results of labs. Future Appointments  Date Time Provider Department Center  09/29/2020  3:30 PM Judd Gaudier, NP [facility] None  02/16/2021  2:00 PM [name], MD [facility] None    HPI 64 y.o. male  presents for 3 month follow up with hypertension, hyperlipidemia, prediabetes and vitamin D.  Has 11 grandkids, 11th due in Nov little girl, 20 month old and 62 year old, holly's kids come and stay with them. Back to working full days at [employer].   BMI is There is no height or weight on file to calculate BMI., he has been working on diet and exercise.  Watching portions, walks daily 15-20 min.  Has cut out alcohol since Feb 2021, 7 per week.  Wt Readings from Last 3 Encounters:  06/23/20 212 lb 12.8 oz (96.5 kg)  03/30/20 209 lb (94.8 kg)  12/10/19 213 lb 3.2 oz (96.7 kg)    His blood pressure has been controlled at home,  Enalapril 20 mg daily in AM and he is on propanolol ER for tremor, today their BP is  .    He does workout. He denies chest pain, shortness of breath, dizziness.     He is on cholesterol medication, crestor 20 mg and denies myalgias. His cholesterol is at goal. The cholesterol last visit was:   Lab Results  Component Value Date   CHOL 175  06/23/2020   HDL 47 06/23/2020   LDLCALC 97 06/23/2020   TRIG 225 (H) 06/23/2020   CHOLHDL 3.7 06/23/2020    He has been working on diet and exercise for hx of prediabetes (A1C 5.7% in 2015), and denies foot ulcerations, hyperglycemia, hypoglycemia , increased appetite, nausea, paresthesia of the feet, polydipsia, polyuria, visual disturbances, vomiting and weight loss.  Last A1C in the office was:  Lab Results  Component Value Date   HGBA1C 5.4 06/23/2020   Patient is on Vitamin D supplement, was advised to increase from 5000 IU to 10000 IU at last visit but didn't do so Lab Results  Component Value Date   VD25OH 54 06/23/2020     He is on thyroid medication. His medication was not changed last visit.   Lab Results  Component Value Date   TSH 1.53 06/23/2020     Current Medications:  Current Outpatient Medications on File Prior to Visit  Medication Sig Dispense Refill  . aspirin 81 MG tablet Take 81 mg by mouth daily.    . Cholecalciferol (VITAMIN D PO) Take 5,000 Units by mouth daily.    Marland Kitchen DIAZEPAM PO Take by mouth as needed.    Marland Kitchen levothyroxine (EUTHYROX) 50 MCG tablet Take 1 tablet daily on an empty stomach with only water for 30 minutes & no Antacid meds, Calcium or Magnesium for 4 hours & avoid Biotin 90 tablet 3  . Magnesium 250 MG TABS  Take 250 mg by mouth daily.    . Multiple Vitamins-Minerals (MULTIVITAMIN PO) Take by mouth.    . olmesartan (BENICAR) 40 MG tablet Take      1 tablet      Daily      for BP 90 tablet 0  . propranolol ER (INDERAL LA) 120 MG 24 hr capsule Take      1 capsule      Daily       for BP & Tremor 90 capsule 0  . rosuvastatin (CRESTOR) 20 MG tablet Take 1 tablet daily for cholesterol. 90 tablet 3  . zinc gluconate 50 MG tablet Take 50 mg by mouth daily.     No current facility-administered medications on file prior to visit.    Medical History:  Past Medical History:  Diagnosis Date  . Hyperlipidemia   . Hypertension   . LBBB (left bundle  branch block)   . Prediabetes   . Thyroid disease    HYPO  . Varicose veins   . Vitamin D deficiency     Allergies:  Allergies  Allergen Reactions  . Simvastatin   . Iodine Rash     Review of Systems:  Review of Systems  Constitutional: Negative for chills, fever and malaise/fatigue.  HENT: Negative for congestion, ear pain and sore throat.   Eyes: Negative.   Respiratory: Negative for cough, shortness of breath and wheezing.   Cardiovascular: Negative for chest pain, palpitations and leg swelling.  Gastrointestinal: Negative for abdominal pain, blood in stool, constipation, diarrhea, heartburn and melena.  Genitourinary: Negative.   Skin: Negative.   Neurological: Negative for dizziness, sensory change, loss of consciousness and headaches.  Psychiatric/Behavioral: Negative for depression. The patient is not nervous/anxious and does not have insomnia.     Family history- Review and unchanged  Social history- Review and unchanged  Physical Exam: There were no vitals taken for this visit. Wt Readings from Last 3 Encounters:  06/23/20 212 lb 12.8 oz (96.5 kg)  03/30/20 209 lb (94.8 kg)  12/10/19 213 lb 3.2 oz (96.7 kg)    General Appearance: Well nourished well developed, in no apparent distress. Eyes: PERRLA, EOMs, conjunctiva no swelling or erythema ENT/Mouth: Ear canals normal without obstruction, swelling, erythma, discharge.  TMs normal bilaterally.  Oropharynx moist, clear, without exudate, or postoropharyngeal swelling. Neck: Supple, thyroid normal,no cervical adenopathy  Respiratory: Respiratory effort normal, Breath sounds clear A&P without rhonchi, wheeze, or rale.  No retractions, no accessory usage. Cardio: RRR with no MRGs. Brisk peripheral pulses without edema.  Abdomen: Soft, + BS,  Non tender, no guarding, rebound, hernias, masses. Musculoskeletal: Full ROM, 5/5 strength, Normal gait Skin: Warm, dry without rashes, ecchymosis. He has several areas to face  with erythematous base, crusty top, suspect for possible actinic keratoses Neuro: Awake and oriented X 3, Cranial nerves intact. Normal muscle tone, no cerebellar symptoms. Intention tremor and slight head tremor.  Psych: Normal affect, Insight and Judgment appropriate.    [name], NP 8:37 AM [facility]

## 2020-10-26 NOTE — Progress Notes (Signed)
Assessment and Plan:   Hypertension:  Atypically elevated; reports occasional checks at home are at goal  Start checking BPs BID; if AM elevations only, move olmesartan to PM Call back if persistently above goal; recheck NV in 1 month  -monitor blood pressure at home, goal <130/80 -Continue DASH diet.   -Reminder to go to the ER if any CP, SOB, nausea, dizziness, severe HA, changes vision/speech, left arm numbness and tingling, and jaw pain.  Cholesterol: -cont crestor -at goal -Continue diet and exercise.  -Check cholesterol.   Abnormal glucose -Continue diet and exercise.   Vitamin D Def: -continue medications.   Hypothyroidism -cont levothyroxine -TSH -dose adjust if necessary  Actinic keratoses Several extensive areas, has ? Done fluorouracil Advised schedule appointment for cryo or follow up derm  Continue diet and meds as discussed. Further disposition pending results of labs. Future Appointments  Date Time Provider Department Center  10/27/2020  8:45 AM Judd Gaudier, NP [facility] None  02/16/2021  2:00 PM [name], MD [facility] None    HPI 64 y.o. male  presents for 3 month follow up with hypertension, hyperlipidemia, prediabetes and vitamin D.  Has 11 grandkids, 11th born in Nov 2021, little girl. Holly's kids come and stay with him.  Back to working full days at [employer]'s office, busy/stressful few weeks.   BMI is Body mass index is 27.86 kg/m., he has been working on diet and exercise. Walks daily 15-20 min. He admits lots of sweets over the holidays, needs to cut back. Would like to get <200 lb.  Has cut out alcohol since Feb 2021, 7 per week.  Wt Readings from Last 3 Encounters:  10/27/20 217 lb (98.4 kg)  06/23/20 212 lb 12.8 oz (96.5 kg)  03/30/20 209 lb (94.8 kg)    His blood pressure has been controlled at home (120s/70, however admits checking irregularly, only 1-2 times per month),  olmesartan 40 mg mg daily in AM and he is  on propanolol ER 120 mg at night, today their BP is BP: (!) 150/84. Similar on manual recheck.   He does workout. He denies chest pain, shortness of breath, dizziness.     He is on cholesterol medication, crestor 20 mg and denies myalgias. His cholesterol is at goal. The cholesterol last visit was:   Lab Results  Component Value Date   CHOL 175 06/23/2020   HDL 47 06/23/2020   LDLCALC 97 06/23/2020   TRIG 225 (H) 06/23/2020   CHOLHDL 3.7 06/23/2020    He has been working on diet and exercise for hx of prediabetes (A1C 5.7% in 2015), and denies foot ulcerations, hyperglycemia, hypoglycemia , increased appetite, nausea, paresthesia of the feet, polydipsia, polyuria, visual disturbances, vomiting and weight loss.  Last A1C in the office was:  Lab Results  Component Value Date   HGBA1C 5.4 06/23/2020   Patient is on Vitamin D supplement, was advised to increase from 5000 IU to 10000 IU at last visit but didn't do so Lab Results  Component Value Date   VD25OH 65 06/23/2020     He is on thyroid medication. His medication was not changed last visit.  Taking 50 mcg.  Lab Results  Component Value Date   TSH 1.53 06/23/2020     Current Medications:  Current Outpatient Medications on File Prior to Visit  Medication Sig Dispense Refill   aspirin 81 MG tablet Take 81 mg by mouth daily.     Cholecalciferol (VITAMIN D PO) Take 5,000 Units  by mouth daily.     DIAZEPAM PO Take by mouth as needed.     levothyroxine (EUTHYROX) 50 MCG tablet Take 1 tablet daily on an empty stomach with only water for 30 minutes & no Antacid meds, Calcium or Magnesium for 4 hours & avoid Biotin 90 tablet 3   Magnesium 250 MG TABS Take 250 mg by mouth daily.     Multiple Vitamins-Minerals (MULTIVITAMIN PO) Take by mouth.     olmesartan (BENICAR) 40 MG tablet Take      1 tablet      Daily      for BP 90 tablet 0   propranolol ER (INDERAL LA) 120 MG 24 hr capsule Take      1 capsule      Daily       for BP &  Tremor 90 capsule 0   rosuvastatin (CRESTOR) 20 MG tablet Take 1 tablet daily for cholesterol. 90 tablet 3   zinc gluconate 50 MG tablet Take 50 mg by mouth daily.     No current facility-administered medications on file prior to visit.    Medical History:  Past Medical History:  Diagnosis Date   Hyperlipidemia    Hypertension    LBBB (left bundle branch block)    Prediabetes    Thyroid disease    HYPO   Varicose veins    Vitamin D deficiency     Allergies:  Allergies  Allergen Reactions   Simvastatin    Iodine Rash     Review of Systems:  Review of Systems  Constitutional: Negative for chills, fever and malaise/fatigue.  HENT: Negative for congestion, ear pain and sore throat.   Eyes: Negative.   Respiratory: Negative for cough, shortness of breath and wheezing.   Cardiovascular: Negative for chest pain, palpitations and leg swelling.  Gastrointestinal: Negative for abdominal pain, blood in stool, constipation, diarrhea, heartburn and melena.  Genitourinary: Negative.   Skin: Negative.   Neurological: Negative for dizziness, sensory change, loss of consciousness and headaches.  Psychiatric/Behavioral: Negative for depression. The patient is not nervous/anxious and does not have insomnia.     Family history- Review and unchanged  Social history- Review and unchanged  Physical Exam: BP (!) 150/84    Pulse (!) 52    Temp (!) 97.5 F (36.4 C)    Wt 217 lb (98.4 kg)    SpO2 99%    BMI 27.86 kg/m  Wt Readings from Last 3 Encounters:  10/27/20 217 lb (98.4 kg)  06/23/20 212 lb 12.8 oz (96.5 kg)  03/30/20 209 lb (94.8 kg)    General Appearance: Well nourished well developed, in no apparent distress. Eyes: PERRLA, EOMs, conjunctiva no swelling or erythema ENT/Mouth: Ear canals normal without obstruction, swelling, erythma, discharge.  TMs normal bilaterally.  Oropharynx moist, clear, without exudate, or postoropharyngeal swelling. Neck: Supple, thyroid  normal,no cervical adenopathy  Respiratory: Respiratory effort normal, Breath sounds clear A&P without rhonchi, wheeze, or rale.  No retractions, no accessory usage. Cardio: RRR with no MRGs. Brisk peripheral pulses without edema.  Abdomen: Soft, + BS,  Non tender, no guarding, rebound, hernias, masses. Musculoskeletal: Full ROM, 5/5 strength, Normal gait Skin: Warm, dry without rashes, ecchymosis. He has several areas to face with erythematous base, crusty top, suspect for possible actinic keratoses Neuro: Awake and oriented X 3, Cranial nerves intact. Normal muscle tone, no cerebellar symptoms. Intention tremor and slight head tremor.  Psych: Normal affect, Insight and Judgment appropriate.    Carlyon Shadow  Laurence Aly, NP 8:43 AM [facility]

## 2020-10-27 ENCOUNTER — Ambulatory Visit (INDEPENDENT_AMBULATORY_CARE_PROVIDER_SITE_OTHER): Payer: 59 | Admitting: Adult Health

## 2020-10-27 ENCOUNTER — Other Ambulatory Visit: Payer: Self-pay

## 2020-10-27 ENCOUNTER — Encounter: Payer: Self-pay | Admitting: Adult Health

## 2020-10-27 VITALS — BP 148/80 | HR 52 | Temp 97.5°F | Wt 217.0 lb

## 2020-10-27 DIAGNOSIS — I10 Essential (primary) hypertension: Secondary | ICD-10-CM | POA: Diagnosis not present

## 2020-10-27 DIAGNOSIS — E559 Vitamin D deficiency, unspecified: Secondary | ICD-10-CM

## 2020-10-27 DIAGNOSIS — E782 Mixed hyperlipidemia: Secondary | ICD-10-CM | POA: Diagnosis not present

## 2020-10-27 DIAGNOSIS — R251 Tremor, unspecified: Secondary | ICD-10-CM

## 2020-10-27 DIAGNOSIS — E039 Hypothyroidism, unspecified: Secondary | ICD-10-CM

## 2020-10-27 DIAGNOSIS — Z6827 Body mass index (BMI) 27.0-27.9, adult: Secondary | ICD-10-CM

## 2020-10-27 DIAGNOSIS — Z79899 Other long term (current) drug therapy: Secondary | ICD-10-CM

## 2020-10-27 NOTE — Patient Instructions (Addendum)
Goals    . Exercise 150 min/wk Moderate Activity    . Weight (lb) < 200 lb (90.7 kg)     End goal 190-195lb        Please start checking blood pressure twice daily for the next few days  After 3 days compare morning VS night time numbers; if running high in morning only, try moving olmesartan to night and continue to monitor   If persistently running above goal, please call to let me know  We will call you in 2 weeks to review numbers if we haven't heard from you    HYPERTENSION INFORMATION  Monitor your blood pressure at home, please keep a record and bring that in with you to your next office visit.   Go to the ER if any CP, SOB, nausea, dizziness, severe HA, changes vision/speech  Testing/Procedures: HOW TO TAKE YOUR BLOOD PRESSURE:  Rest 5 minutes before taking your blood pressure.  Don't smoke or drink caffeinated beverages for at least 30 minutes before.  Take your blood pressure before (not after) you eat.  Sit comfortably with your back supported and both feet on the floor (don't cross your legs).  Elevate your arm to heart level on a table or a desk.  Use the proper sized cuff. It should fit smoothly and snugly around your bare upper arm. There should be enough room to slip a fingertip under the cuff. The bottom edge of the cuff should be 1 inch above the crease of the elbow.  Due to a recent study, SPRINT, we have changed our goal for the systolic or top blood pressure number. Ideally we want your top number at 120.  In the [facility] Trial, 5000 people were randomized to a goal BP of 120 and 5000 people were randomized to a goal BP of less than 140. The patients with the goal BP at 120 had LESS DEMENTIA, LESS HEART ATTACKS, AND LESS STROKES, AS WELL AS OVERALL DECREASED MORTALITY OR DEATH RATE.   There was another study that showed taking your blood pressure medications at night decrease cardiovascular events.  However if you are on a fluid pill, please take this in  the morning.   If you are willing, our goal BP is the top number of 120.  Your most recent BP: BP: (!) 150/84   Take your medications faithfully as instructed. Maintain a healthy weight. Get at least 150 minutes of aerobic exercise per week. Minimize salt intake. Minimize alcohol intake  DASH Eating Plan DASH stands for "Dietary Approaches to Stop Hypertension." The DASH eating plan is a healthy eating plan that has been shown to reduce high blood pressure (hypertension). Additional health benefits may include reducing the risk of type 2 diabetes mellitus, heart disease, and stroke. The DASH eating plan may also help with weight loss. WHAT DO I NEED TO KNOW ABOUT THE DASH EATING PLAN? For the DASH eating plan, you will follow these general guidelines:  Choose foods with a percent daily value for sodium of less than 5% (as listed on the food label).  Use salt-free seasonings or herbs instead of table salt or sea salt.  Check with your health care provider or pharmacist before using salt substitutes.  Eat lower-sodium products, often labeled as "lower sodium" or "no salt added."  Eat fresh foods.  Eat more vegetables, fruits, and low-fat dairy products.  Choose whole grains. Look for the word "whole" as the first word in the ingredient list.  Choose fish and  skinless chicken or [place] more often than red meat. Limit fish, poultry, and meat to 6 oz (170 g) each day.  Limit sweets, desserts, sugars, and sugary drinks.  Choose heart-healthy fats.  Limit cheese to 1 oz (28 g) per day.  Eat more home-cooked food and less restaurant, buffet, and fast food.  Limit fried foods.  Cook foods using methods other than frying.  Limit canned vegetables. If you do use them, rinse them well to decrease the sodium.  When eating at a restaurant, ask that your food be prepared with less salt, or no salt if possible. WHAT FOODS CAN I EAT? Seek help from a dietitian for individual calorie  needs. Grains Whole grain or whole wheat bread. Brown rice. Whole grain or whole wheat pasta. Quinoa, bulgur, and whole grain cereals. Low-sodium cereals. Corn or whole wheat flour tortillas. Whole grain cornbread. Whole grain crackers. Low-sodium crackers. Vegetables Fresh or frozen vegetables (raw, steamed, roasted, or grilled). Low-sodium or reduced-sodium tomato and vegetable juices. Low-sodium or reduced-sodium tomato sauce and paste. Low-sodium or reduced-sodium canned vegetables.  Fruits All fresh, canned (in natural juice), or frozen fruits. Meat and Other Protein Products Ground beef (85% or leaner), grass-fed beef, or beef trimmed of fat. Skinless chicken or [place]. Ground chicken or [place]. Pork trimmed of fat. All fish and seafood. Eggs. Dried beans, peas, or lentils. Unsalted nuts and seeds. Unsalted canned beans. Dairy Low-fat dairy products, such as skim or 1% milk, 2% or reduced-fat cheeses, low-fat ricotta or cottage cheese, or plain low-fat yogurt. Low-sodium or reduced-sodium cheeses. Fats and Oils Tub margarines without trans fats. Light or reduced-fat mayonnaise and salad dressings (reduced sodium). Avocado. Safflower, olive, or canola oils. Natural peanut or almond butter. Other Unsalted popcorn and pretzels. The items listed above may not be a complete list of recommended foods or beverages. Contact your dietitian for more options. WHAT FOODS ARE NOT RECOMMENDED? Grains White bread. White pasta. White rice. Refined cornbread. Bagels and croissants. Crackers that contain trans fat. Vegetables Creamed or fried vegetables. Vegetables in a cheese sauce. Regular canned vegetables. Regular canned tomato sauce and paste. Regular tomato and vegetable juices. Fruits Dried fruits. Canned fruit in light or heavy syrup. Fruit juice. Meat and Other Protein Products Fatty cuts of meat. Ribs, chicken wings, bacon, sausage, bologna, salami, chitterlings, fatback, hot dogs, bratwurst,  and packaged luncheon meats. Salted nuts and seeds. Canned beans with salt. Dairy Whole or 2% milk, cream, half-and-half, and cream cheese. Whole-fat or sweetened yogurt. Full-fat cheeses or blue cheese. Nondairy creamers and whipped toppings. Processed cheese, cheese spreads, or cheese curds. Condiments Onion and garlic salt, seasoned salt, table salt, and sea salt. Canned and packaged gravies. Worcestershire sauce. Tartar sauce. Barbecue sauce. Teriyaki sauce. Soy sauce, including reduced sodium. Steak sauce. Fish sauce. Oyster sauce. Cocktail sauce. Horseradish. Ketchup and mustard. Meat flavorings and tenderizers. Bouillon cubes. Hot sauce. Tabasco sauce. Marinades. Taco seasonings. Relishes. Fats and Oils Butter, stick margarine, lard, shortening, ghee, and bacon fat. Coconut, palm kernel, or palm oils. Regular salad dressings. Other Pickles and olives. Salted popcorn and pretzels. The items listed above may not be a complete list of foods and beverages to avoid. Contact your dietitian for more information. WHERE CAN I FIND MORE INFORMATION? National Heart, Lung, and Blood Institute: [employer] Document Released: 10/04/2011 Document Revised: 03/01/2014 Document Reviewed: 08/19/2013 [facility] Patient Information 2015 [place], [place]. This information is not intended to replace advice given to you by your health care provider. Make sure you discuss any  questions you have with your health care provider.

## 2020-10-28 LAB — COMPLETE METABOLIC PANEL WITH GFR
AG Ratio: 2 (calc) (ref 1.0–2.5)
ALT: 37 U/L (ref 9–46)
AST: 23 U/L (ref 10–35)
Albumin: 4.7 g/dL (ref 3.6–5.1)
Alkaline phosphatase (APISO): 94 U/L (ref 35–144)
BUN: 12 mg/dL (ref 7–25)
CO2: 27 mmol/L (ref 20–32)
Calcium: 9.5 mg/dL (ref 8.6–10.3)
Chloride: 103 mmol/L (ref 98–110)
Creat: 1 mg/dL (ref 0.70–1.25)
GFR, Est African American: 93 mL/min/{1.73_m2} (ref >=60)
GFR, Est Non African American: 80 mL/min/{1.73_m2} (ref >=60)
Globulin: 2.3 g/dL (calc) (ref 1.9–3.7)
Glucose, Bld: 90 mg/dL (ref 65–99)
Potassium: 4.7 mmol/L (ref 3.5–5.3)
Sodium: 138 mmol/L (ref 135–146)
Total Bilirubin: 0.4 mg/dL (ref 0.2–1.2)
Total Protein: 7 g/dL (ref 6.1–8.1)

## 2020-10-28 LAB — CBC WITH DIFFERENTIAL/PLATELET
Absolute Monocytes: 421 cells/uL (ref 200–950)
Basophils Absolute: 39 cells/uL (ref 0–200)
Basophils Relative: 0.8 %
Eosinophils Absolute: 123 cells/uL (ref 15–500)
Eosinophils Relative: 2.5 %
HCT: 45.2 % (ref 38.5–50.0)
Hemoglobin: 15.7 g/dL (ref 13.2–17.1)
Lymphs Abs: 1862 cells/uL (ref 850–3900)
MCH: 30.8 pg (ref 27.0–33.0)
MCHC: 34.7 g/dL (ref 32.0–36.0)
MCV: 88.6 fL (ref 80.0–100.0)
MPV: 10.5 fL (ref 7.5–12.5)
Monocytes Relative: 8.6 %
Neutro Abs: 2455 cells/uL (ref 1500–7800)
Neutrophils Relative %: 50.1 %
Platelets: 204 10*3/uL (ref 140–400)
RBC: 5.1 10*6/uL (ref 4.20–5.80)
RDW: 12.8 % (ref 11.0–15.0)
Total Lymphocyte: 38 %
WBC: 4.9 10*3/uL (ref 3.8–10.8)

## 2020-10-28 LAB — LIPID PANEL
Cholesterol: 136 mg/dL (ref <200)
HDL: 43 mg/dL (ref >=40)
LDL Cholesterol (Calc): 75 mg/dL (calc)
Non-HDL Cholesterol (Calc): 93 mg/dL (calc) (ref <130)
Total CHOL/HDL Ratio: 3.2 (calc) (ref <5.0)
Triglycerides: 101 mg/dL (ref <150)

## 2020-10-28 LAB — MAGNESIUM: Magnesium: 2 mg/dL (ref 1.5–2.5)

## 2020-10-28 LAB — TSH: TSH: 1.53 mIU/L (ref 0.40–4.50)

## 2020-11-01 ENCOUNTER — Other Ambulatory Visit: Payer: Self-pay | Admitting: Internal Medicine

## 2020-11-01 DIAGNOSIS — R251 Tremor, unspecified: Secondary | ICD-10-CM

## 2020-11-01 MED ORDER — DIAZEPAM 5 MG PO TABS: ORAL_TABLET | ORAL | 0 refills | Status: DC

## 2020-11-22 ENCOUNTER — Telehealth: Payer: Self-pay

## 2020-11-22 NOTE — Telephone Encounter (Signed)
Left message on voice mail  to call back

## 2020-11-22 NOTE — Telephone Encounter (Signed)
-----   Message from Judd Gaudier, NP sent at 10/27/2020  8:56 AM EST ----- Regarding: BP Please call to follow up on home BP numbers if he hasn't reached back to [place] already. Thanks!

## 2020-11-28 ENCOUNTER — Other Ambulatory Visit: Payer: Self-pay | Admitting: *Deleted

## 2020-11-28 DIAGNOSIS — I10 Essential (primary) hypertension: Secondary | ICD-10-CM

## 2020-11-28 DIAGNOSIS — G250 Essential tremor: Secondary | ICD-10-CM

## 2020-11-28 MED ORDER — PROPRANOLOL HCL ER 120 MG PO CP24: ORAL_CAPSULE | ORAL | 0 refills | Status: DC

## 2020-12-20 ENCOUNTER — Encounter: Payer: 59 | Admitting: Internal Medicine

## 2020-12-21 ENCOUNTER — Other Ambulatory Visit: Payer: Self-pay | Admitting: Internal Medicine

## 2020-12-21 DIAGNOSIS — I10 Essential (primary) hypertension: Secondary | ICD-10-CM

## 2021-02-09 ENCOUNTER — Other Ambulatory Visit: Payer: Self-pay | Admitting: Internal Medicine

## 2021-02-09 DIAGNOSIS — I10 Essential (primary) hypertension: Secondary | ICD-10-CM

## 2021-02-09 DIAGNOSIS — G250 Essential tremor: Secondary | ICD-10-CM

## 2021-02-16 ENCOUNTER — Ambulatory Visit (INDEPENDENT_AMBULATORY_CARE_PROVIDER_SITE_OTHER): Payer: 59 | Admitting: Internal Medicine

## 2021-02-16 ENCOUNTER — Encounter: Payer: Self-pay | Admitting: Internal Medicine

## 2021-02-16 ENCOUNTER — Other Ambulatory Visit: Payer: Self-pay

## 2021-02-16 VITALS — BP 128/84 | HR 51 | Temp 97.9°F | Resp 16 | Ht 73.5 in | Wt 214.4 lb

## 2021-02-16 DIAGNOSIS — R7309 Other abnormal glucose: Secondary | ICD-10-CM

## 2021-02-16 DIAGNOSIS — Z1212 Encounter for screening for malignant neoplasm of rectum: Secondary | ICD-10-CM

## 2021-02-16 DIAGNOSIS — E039 Hypothyroidism, unspecified: Secondary | ICD-10-CM

## 2021-02-16 DIAGNOSIS — Z1211 Encounter for screening for malignant neoplasm of colon: Secondary | ICD-10-CM

## 2021-02-16 DIAGNOSIS — Z136 Encounter for screening for cardiovascular disorders: Secondary | ICD-10-CM | POA: Diagnosis not present

## 2021-02-16 DIAGNOSIS — Z79899 Other long term (current) drug therapy: Secondary | ICD-10-CM

## 2021-02-16 DIAGNOSIS — R5383 Other fatigue: Secondary | ICD-10-CM

## 2021-02-16 DIAGNOSIS — I10 Essential (primary) hypertension: Secondary | ICD-10-CM | POA: Diagnosis not present

## 2021-02-16 DIAGNOSIS — Z0000 Encounter for general adult medical examination without abnormal findings: Secondary | ICD-10-CM | POA: Diagnosis not present

## 2021-02-16 DIAGNOSIS — E559 Vitamin D deficiency, unspecified: Secondary | ICD-10-CM

## 2021-02-16 DIAGNOSIS — Z8249 Family history of ischemic heart disease and other diseases of the circulatory system: Secondary | ICD-10-CM

## 2021-02-16 DIAGNOSIS — I447 Left bundle-branch block, unspecified: Secondary | ICD-10-CM

## 2021-02-16 DIAGNOSIS — Z111 Encounter for screening for respiratory tuberculosis: Secondary | ICD-10-CM

## 2021-02-16 DIAGNOSIS — E782 Mixed hyperlipidemia: Secondary | ICD-10-CM

## 2021-02-16 DIAGNOSIS — G250 Essential tremor: Secondary | ICD-10-CM

## 2021-02-16 DIAGNOSIS — Z125 Encounter for screening for malignant neoplasm of prostate: Secondary | ICD-10-CM

## 2021-02-16 DIAGNOSIS — Z0001 Encounter for general adult medical examination with abnormal findings: Secondary | ICD-10-CM
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
## 2021-02-16 NOTE — Patient Instructions (Signed)

## 2021-02-16 NOTE — Progress Notes (Signed)
AortaScan < 3 cm. Within normal limits, per Dr Mckeown. 

## 2021-02-17 LAB — URINALYSIS, ROUTINE W REFLEX MICROSCOPIC
Bacteria, UA: NONE SEEN /HPF
Bilirubin Urine: NEGATIVE
Glucose, UA: NEGATIVE
Hgb urine dipstick: NEGATIVE
Hyaline Cast: NONE SEEN /LPF
Ketones, ur: NEGATIVE
Leukocytes,Ua: NEGATIVE
Nitrite: NEGATIVE
RBC / HPF: NONE SEEN /HPF (ref 0–2)
Specific Gravity, Urine: 1.009 (ref 1.001–1.035)
Squamous Epithelial / HPF: NONE SEEN /HPF (ref <=5)
WBC, UA: NONE SEEN /HPF (ref 0–5)
pH: 7 (ref 5.0–8.0)

## 2021-02-17 LAB — CBC WITH DIFFERENTIAL/PLATELET
Absolute Monocytes: 474 cells/uL (ref 200–950)
Basophils Absolute: 30 cells/uL (ref 0–200)
Basophils Relative: 0.5 %
Eosinophils Absolute: 120 cells/uL (ref 15–500)
Eosinophils Relative: 2 %
HCT: 47.8 % (ref 38.5–50.0)
Hemoglobin: 16.2 g/dL (ref 13.2–17.1)
Lymphs Abs: 2214 cells/uL (ref 850–3900)
MCH: 30.1 pg (ref 27.0–33.0)
MCHC: 33.9 g/dL (ref 32.0–36.0)
MCV: 88.7 fL (ref 80.0–100.0)
MPV: 10.6 fL (ref 7.5–12.5)
Monocytes Relative: 7.9 %
Neutro Abs: 3162 cells/uL (ref 1500–7800)
Neutrophils Relative %: 52.7 %
Platelets: 208 10*3/uL (ref 140–400)
RBC: 5.39 10*6/uL (ref 4.20–5.80)
RDW: 13.2 % (ref 11.0–15.0)
Total Lymphocyte: 36.9 %
WBC: 6 10*3/uL (ref 3.8–10.8)

## 2021-02-17 LAB — COMPLETE METABOLIC PANEL WITH GFR
AG Ratio: 1.9 (calc) (ref 1.0–2.5)
ALT: 31 U/L (ref 9–46)
AST: 24 U/L (ref 10–35)
Albumin: 4.6 g/dL (ref 3.6–5.1)
Alkaline phosphatase (APISO): 85 U/L (ref 35–144)
BUN: 11 mg/dL (ref 7–25)
CO2: 29 mmol/L (ref 20–32)
Calcium: 9.7 mg/dL (ref 8.6–10.3)
Chloride: 99 mmol/L (ref 98–110)
Creat: 1 mg/dL (ref 0.70–1.25)
GFR, Est African American: 92 mL/min/{1.73_m2} (ref >=60)
GFR, Est Non African American: 80 mL/min/{1.73_m2} (ref >=60)
Globulin: 2.4 g/dL (calc) (ref 1.9–3.7)
Glucose, Bld: 108 mg/dL — ABNORMAL HIGH (ref 65–99)
Potassium: 3.8 mmol/L (ref 3.5–5.3)
Sodium: 137 mmol/L (ref 135–146)
Total Bilirubin: 0.9 mg/dL (ref 0.2–1.2)
Total Protein: 7 g/dL (ref 6.1–8.1)

## 2021-02-17 LAB — IRON, TOTAL/TOTAL IRON BINDING CAP
%SAT: 41 % (calc) (ref 20–48)
Iron: 143 ug/dL (ref 50–180)
TIBC: 352 mcg/dL (calc) (ref 250–425)

## 2021-02-17 LAB — HEMOGLOBIN A1C
Hgb A1c MFr Bld: 5.4 % of total Hgb (ref <5.7)
Mean Plasma Glucose: 108 mg/dL
eAG (mmol/L): 6 mmol/L

## 2021-02-17 LAB — MICROALBUMIN / CREATININE URINE RATIO
Creatinine, Urine: 51 mg/dL (ref 20–320)
Microalb Creat Ratio: 196 mcg/mg creat — ABNORMAL HIGH (ref <30)
Microalb, Ur: 10 mg/dL

## 2021-02-17 LAB — LIPID PANEL
Cholesterol: 165 mg/dL (ref <200)
HDL: 53 mg/dL (ref >=40)
LDL Cholesterol (Calc): 85 mg/dL (calc)
Non-HDL Cholesterol (Calc): 112 mg/dL (calc) (ref <130)
Total CHOL/HDL Ratio: 3.1 (calc) (ref <5.0)
Triglycerides: 168 mg/dL — ABNORMAL HIGH (ref <150)

## 2021-02-17 LAB — MAGNESIUM: Magnesium: 2 mg/dL (ref 1.5–2.5)

## 2021-02-17 LAB — VITAMIN D 25 HYDROXY (VIT D DEFICIENCY, FRACTURES): Vit D, 25-Hydroxy: 54 ng/mL (ref 30–100)

## 2021-02-17 LAB — TESTOSTERONE: Testosterone: 367 ng/dL (ref 250–827)

## 2021-02-17 LAB — TSH: TSH: 1.47 mIU/L (ref 0.40–4.50)

## 2021-02-17 LAB — PSA: PSA: 1.3 ng/mL (ref <=4.00)

## 2021-02-17 LAB — INSULIN, RANDOM: Insulin: 49.2 u[IU]/mL — ABNORMAL HIGH

## 2021-02-17 LAB — VITAMIN B12: Vitamin B-12: 327 pg/mL (ref 200–1100)

## 2021-02-17 LAB — MICROSCOPIC MESSAGE

## 2021-02-17 NOTE — Progress Notes (Signed)
============================================================ ============================================================  -    Iron Levels - Normal  ============================================================ ============================================================  -  PSA - Low - Great  ============================================================ ============================================================  -  Testosterone - Normal / OK  ============================================================ ============================================================  -  Total Chol = 165  and LDL Chol 85 - Both  Excellent   - Very low risk for Heart Attack  / Stroke ========================================================  - A1c - Normal - Great - No Diabetes ! ============================================================ ============================================================  -  Vitamin D = 54 - Slightly low        (Ideal or Goal is betw 70-100)   - Recc INCREASE your Vit D 5,000 caps up to 2 caps = 10,000 units /day  ============================================================ ============================================================  -  All Else - CBC - Kidneys - Electrolytes - Liver - Magnesium & Thyroid    - all  Normal / OK ============================================================ ============================================================  -  Keep up the [place] Work !  ============================================================ ============================================================

## 2021-02-18 ENCOUNTER — Encounter: Payer: Self-pay | Admitting: Internal Medicine

## 2021-02-20 LAB — TB SKIN TEST
Induration: 0 mm
TB Skin Test: NEGATIVE

## 2021-03-12 ENCOUNTER — Other Ambulatory Visit: Payer: Self-pay | Admitting: Internal Medicine

## 2021-03-12 DIAGNOSIS — I10 Essential (primary) hypertension: Secondary | ICD-10-CM

## 2021-05-09 ENCOUNTER — Other Ambulatory Visit: Payer: Self-pay | Admitting: Internal Medicine

## 2021-05-09 DIAGNOSIS — G250 Essential tremor: Secondary | ICD-10-CM

## 2021-05-09 DIAGNOSIS — I10 Essential (primary) hypertension: Secondary | ICD-10-CM

## 2021-06-07 ENCOUNTER — Other Ambulatory Visit: Payer: Self-pay | Admitting: Adult Health

## 2021-08-07 ENCOUNTER — Other Ambulatory Visit: Payer: Self-pay | Admitting: Nurse Practitioner

## 2021-08-07 ENCOUNTER — Other Ambulatory Visit: Payer: Self-pay | Admitting: Internal Medicine

## 2021-08-07 DIAGNOSIS — I10 Essential (primary) hypertension: Secondary | ICD-10-CM

## 2021-08-07 DIAGNOSIS — G250 Essential tremor: Secondary | ICD-10-CM

## 2021-08-29 NOTE — Progress Notes (Signed)
Assessment and Plan:   Hypertension:  -continue meds -monitor blood pressure at home, goal <130/80 -Continue DASH diet.   -Reminder to go to the ER if any CP, SOB, nausea, dizziness, severe HA, changes vision/speech, left arm numbness and tingling, and jaw pain.  Cholesterol: -cont crestor -at goal -Continue diet and exercise.  -Check cholesterol.   Abnormal glucose -Continue diet and exercise.   Vitamin D Def: -continue medications.   Hypothyroidism -cont levothyroxine -TSH -dose adjust if necessary  Actinic keratoses Several extensive areas, has fluorouracil, repeat Advised schedule appointment for cryo or follow up derm if not resolving; protect skin from sun  Left ear eustachian tube dysfunction/effusion -continue Allergy pill, flonase, autoinflation, explained no need for ABX at this time.  - giving steroid taper - if not better 2-4 weeks will refer to ENT  R hip pain/ ?tendonitis - persistent, get xray - rest, steroid taper, ice PRN - slow reintroduction of easy walking/activities, increase gradually as tolerated - refer to ortho if xray normal and persistent despite above  Need for influenza vaccine Quadrivalent flu vaccine administered without complication today    Continue diet and meds as discussed. Further disposition pending results of labs. Future Appointments  Date Time Provider Department Center  02/20/2022  3:00 PM [name], MD [facility] None    HPI 65 y.o. male  presents for 3 month follow up with hypertension, hyperlipidemia, prediabetes, hypothyroid and vitamin D.  Has 11 grandkids, 11th born in Nov 2021, little girl. Holly's kids come and stay with him. Fully retired April 2022.   He takes propranolol and PRN valium for essential tremor.   He reports persistent fullness in left ear, feels like fluid, has tried claritin and flonase for 2 months, has tried autoinflation which will resolve briefly but persistent "fullness".    He  reports 2-3 months of R anterior hip pain, some unsteady sensation with sitting to sanding, intermittent, will get worse after walking 200 yards, climbing stairs; taking tylenol with improvement but overall progressive/not resolving. Doesn't note stiffness.   BMI is Body mass index is 28.27 kg/m., he has been working on diet and exercise. More sedentary since retirement other than active with kids.  Has cut out alcohol since Feb 2021, 7 per week.  Wt Readings from Last 3 Encounters:  08/30/21 217 lb 3.2 oz (98.5 kg)  02/16/21 214 lb 6.4 oz (97.3 kg)  10/27/20 217 lb (98.4 kg)    His blood pressure has been controlled at home (120s/70), today their BP is BP: 130/82.  He does workout. He denies chest pain, dizziness. Mildly increased dyspne   He is on cholesterol medication, crestor 20 mg and denies myalgias. His cholesterol is at goal. The cholesterol last visit was:   Lab Results  Component Value Date   CHOL 165 02/16/2021   HDL 53 02/16/2021   LDLCALC 85 02/16/2021   TRIG 168 (H) 02/16/2021   CHOLHDL 3.1 02/16/2021    He has been working on diet and exercise for hx of prediabetes (A1C 5.7% in 2015), and denies foot ulcerations, hyperglycemia, hypoglycemia , increased appetite, nausea, paresthesia of the feet, polydipsia, polyuria, visual disturbances, vomiting and weight loss.  Last A1C in the office was:  Lab Results  Component Value Date   HGBA1C 5.4 02/16/2021   Patient is on Vitamin D supplement, taking 5000 IU, no dose changes Lab Results  Component Value Date   VD25OH 76 02/16/2021     He is on thyroid medication for hypothyroid since  2012. His medication was not changed last visit.  Taking 50 mcg.  Lab Results  Component Value Date   TSH 1.47 02/16/2021     Current Medications:  Current Outpatient Medications on File Prior to Visit  Medication Sig Dispense Refill   aspirin 81 MG tablet Take 81 mg by mouth daily.     Cholecalciferol (VITAMIN D PO) Take 5,000 Units  by mouth daily.     diazepam (VALIUM) 5 MG tablet Take      1/2 to 1 tablet        3 x /day       for Tremor 90 tablet 0   levothyroxine (SYNTHROID) 50 MCG tablet TAKE 1 TABLET BY MOUTH DAILY ON EMPTY stomach (take with water ONLY wait 30 MINUTES, no calcium / magnesium FOR FOUR hours) 270 tablet 0   Magnesium 250 MG TABS Take 250 mg by mouth daily.     Multiple Vitamins-Minerals (MULTIVITAMIN PO) Take by mouth.     olmesartan (BENICAR) 40 MG tablet Take 1 tablet  Daily  for BP 90 tablet 3   propranolol ER (INDERAL LA) 120 MG 24 hr capsule TAKE ONE CAPSULE BY MOUTH DAILY 90 capsule 0   rosuvastatin (CRESTOR) 20 MG tablet TAKE 1 TABLET BY MOUTH DAILY FOR cholesterol 210 tablet 0   zinc gluconate 50 MG tablet Take 50 mg by mouth daily.     No current facility-administered medications on file prior to visit.    Medical History:  Past Medical History:  Diagnosis Date   Hyperlipidemia    Hypertension    LBBB (left bundle branch block)    Prediabetes    Thyroid disease    HYPO   Varicose veins    Vitamin D deficiency     Allergies:  Allergies  Allergen Reactions   Simvastatin    Iodine Rash     Review of Systems:  Review of Systems  Constitutional:  Negative for malaise/fatigue and weight loss.  HENT:  Negative for hearing loss and tinnitus.   Eyes:  Negative for blurred vision and double vision.  Respiratory:  Negative for cough, shortness of breath and wheezing.   Cardiovascular:  Negative for chest pain, palpitations, orthopnea, claudication and leg swelling.  Gastrointestinal:  Negative for abdominal pain, blood in stool, constipation, diarrhea, heartburn, melena, nausea and vomiting.  Genitourinary: Negative.   Musculoskeletal:  Positive for joint pain (R hip/groin, anterior). Negative for back pain and myalgias.  Skin:  Negative for rash.  Neurological:  Negative for dizziness, tingling, sensory change, weakness and headaches.  Endo/Heme/Allergies:  Negative for  polydipsia.  Psychiatric/Behavioral: Negative.    All other systems reviewed and are negative.  Family history- Review and unchanged  Social history- Review and unchanged  Physical Exam: BP 130/82   Pulse (!) 59   Temp 97.9 F (36.6 C)   Wt 217 lb 3.2 oz (98.5 kg)   SpO2 97%   BMI 28.27 kg/m  Wt Readings from Last 3 Encounters:  08/30/21 217 lb 3.2 oz (98.5 kg)  02/16/21 214 lb 6.4 oz (97.3 kg)  10/27/20 217 lb (98.4 kg)    General Appearance: Well nourished well developed, in no apparent distress. Eyes: PERRLA, EOMs, conjunctiva no swelling or erythema ENT/Mouth: Ear canals normal without obstruction, swelling, erythma, discharge.  TMs normal bilaterally.  Oropharynx moist, clear, without exudate, or postoropharyngeal swelling. Neck: Supple, thyroid normal,no cervical adenopathy  Respiratory: Respiratory effort normal, Breath sounds clear A&P without rhonchi, wheeze, or rale.  No  retractions, no accessory usage. Cardio: RRR with no MRGs. Brisk peripheral pulses without edema.  Abdomen: Soft, + BS,  Non tender, no guarding, rebound, hernias, masses. Musculoskeletal: Full ROM, 5/5 strength lumbar and bil hips, tender over anterior hip tendons at  Normal gait Skin: Warm, dry without rashes, ecchymosis. He has several areas to face with erythematous base, crusty top.  Neuro: Awake and oriented X 3, Cranial nerves intact. Normal muscle tone, no cerebellar symptoms. Intention tremor and slight head tremor.  Psych: Normal affect, Insight and Judgment appropriate.    [name], NP 2:45 PM [facility]

## 2021-08-30 ENCOUNTER — Encounter: Payer: Self-pay | Admitting: Adult Health

## 2021-08-30 ENCOUNTER — Other Ambulatory Visit: Payer: Self-pay

## 2021-08-30 ENCOUNTER — Ambulatory Visit (INDEPENDENT_AMBULATORY_CARE_PROVIDER_SITE_OTHER): Payer: 59 | Admitting: Adult Health

## 2021-08-30 VITALS — BP 130/82 | HR 59 | Temp 97.9°F | Wt 217.2 lb

## 2021-08-30 DIAGNOSIS — I10 Essential (primary) hypertension: Secondary | ICD-10-CM

## 2021-08-30 DIAGNOSIS — Z23 Encounter for immunization: Secondary | ICD-10-CM | POA: Diagnosis not present

## 2021-08-30 DIAGNOSIS — E039 Hypothyroidism, unspecified: Secondary | ICD-10-CM | POA: Diagnosis not present

## 2021-08-30 DIAGNOSIS — M25551 Pain in right hip: Secondary | ICD-10-CM

## 2021-08-30 DIAGNOSIS — Z79899 Other long term (current) drug therapy: Secondary | ICD-10-CM | POA: Diagnosis not present

## 2021-08-30 DIAGNOSIS — Z6827 Body mass index (BMI) 27.0-27.9, adult: Secondary | ICD-10-CM | POA: Diagnosis not present

## 2021-08-30 DIAGNOSIS — E782 Mixed hyperlipidemia: Secondary | ICD-10-CM

## 2021-08-30 DIAGNOSIS — E559 Vitamin D deficiency, unspecified: Secondary | ICD-10-CM

## 2021-08-30 MED ORDER — PREDNISONE 20 MG PO TABS: ORAL_TABLET | ORAL | 0 refills | Status: AC

## 2021-08-30 NOTE — Addendum Note (Signed)
Addended by: Dionicio Stall on: 08/30/2021 03:28 PM   Modules accepted: Orders

## 2021-08-31 LAB — COMPLETE METABOLIC PANEL WITH GFR
AG Ratio: 1.8 (calc) (ref 1.0–2.5)
ALT: 23 U/L (ref 9–46)
AST: 18 U/L (ref 10–35)
Albumin: 4.5 g/dL (ref 3.6–5.1)
Alkaline phosphatase (APISO): 82 U/L (ref 35–144)
BUN: 12 mg/dL (ref 7–25)
CO2: 29 mmol/L (ref 20–32)
Calcium: 9.8 mg/dL (ref 8.6–10.3)
Chloride: 103 mmol/L (ref 98–110)
Creat: 1.12 mg/dL (ref 0.70–1.35)
Globulin: 2.5 g/dL (calc) (ref 1.9–3.7)
Glucose, Bld: 98 mg/dL (ref 65–99)
Potassium: 4.7 mmol/L (ref 3.5–5.3)
Sodium: 140 mmol/L (ref 135–146)
Total Bilirubin: 0.5 mg/dL (ref 0.2–1.2)
Total Protein: 7 g/dL (ref 6.1–8.1)
eGFR: 74 mL/min/{1.73_m2} (ref >=60)

## 2021-08-31 LAB — LIPID PANEL
Cholesterol: 196 mg/dL (ref <200)
HDL: 50 mg/dL (ref >=40)
LDL Cholesterol (Calc): 118 mg/dL (calc) — ABNORMAL HIGH
Non-HDL Cholesterol (Calc): 146 mg/dL (calc) — ABNORMAL HIGH (ref <130)
Total CHOL/HDL Ratio: 3.9 (calc) (ref <5.0)
Triglycerides: 164 mg/dL — ABNORMAL HIGH (ref <150)

## 2021-08-31 LAB — MAGNESIUM: Magnesium: 2.2 mg/dL (ref 1.5–2.5)

## 2021-08-31 LAB — CBC WITH DIFFERENTIAL/PLATELET
Absolute Monocytes: 535 cells/uL (ref 200–950)
Basophils Absolute: 49 cells/uL (ref 0–200)
Basophils Relative: 0.9 %
Eosinophils Absolute: 119 cells/uL (ref 15–500)
Eosinophils Relative: 2.2 %
HCT: 44.6 % (ref 38.5–50.0)
Hemoglobin: 15.3 g/dL (ref 13.2–17.1)
Lymphs Abs: 2203 cells/uL (ref 850–3900)
MCH: 30.8 pg (ref 27.0–33.0)
MCHC: 34.3 g/dL (ref 32.0–36.0)
MCV: 89.9 fL (ref 80.0–100.0)
MPV: 10.3 fL (ref 7.5–12.5)
Monocytes Relative: 9.9 %
Neutro Abs: 2495 cells/uL (ref 1500–7800)
Neutrophils Relative %: 46.2 %
Platelets: 208 10*3/uL (ref 140–400)
RBC: 4.96 10*6/uL (ref 4.20–5.80)
RDW: 13.5 % (ref 11.0–15.0)
Total Lymphocyte: 40.8 %
WBC: 5.4 10*3/uL (ref 3.8–10.8)

## 2021-08-31 LAB — TSH: TSH: 2.03 mIU/L (ref 0.40–4.50)

## 2021-11-28 ENCOUNTER — Other Ambulatory Visit: Payer: Self-pay | Admitting: Nurse Practitioner

## 2021-11-28 DIAGNOSIS — I10 Essential (primary) hypertension: Secondary | ICD-10-CM

## 2021-11-28 DIAGNOSIS — G250 Essential tremor: Secondary | ICD-10-CM

## 2022-01-28 ENCOUNTER — Other Ambulatory Visit: Payer: Self-pay | Admitting: Adult Health

## 2022-02-20 ENCOUNTER — Encounter: Payer: Self-pay | Admitting: Internal Medicine

## 2022-02-20 ENCOUNTER — Ambulatory Visit (INDEPENDENT_AMBULATORY_CARE_PROVIDER_SITE_OTHER): Payer: 59 | Admitting: Internal Medicine

## 2022-02-20 VITALS — BP 138/80 | HR 58 | Temp 97.7°F | Ht 73.0 in | Wt 219.8 lb

## 2022-02-20 DIAGNOSIS — R7309 Other abnormal glucose: Secondary | ICD-10-CM

## 2022-02-20 DIAGNOSIS — Z125 Encounter for screening for malignant neoplasm of prostate: Secondary | ICD-10-CM

## 2022-02-20 DIAGNOSIS — E559 Vitamin D deficiency, unspecified: Secondary | ICD-10-CM

## 2022-02-20 DIAGNOSIS — Z79899 Other long term (current) drug therapy: Secondary | ICD-10-CM

## 2022-02-20 DIAGNOSIS — Z136 Encounter for screening for cardiovascular disorders: Secondary | ICD-10-CM

## 2022-02-20 DIAGNOSIS — Z111 Encounter for screening for respiratory tuberculosis: Secondary | ICD-10-CM | POA: Diagnosis not present

## 2022-02-20 DIAGNOSIS — I700 Atherosclerosis of aorta: Secondary | ICD-10-CM | POA: Diagnosis not present

## 2022-02-20 DIAGNOSIS — Z0000 Encounter for general adult medical examination without abnormal findings: Secondary | ICD-10-CM | POA: Diagnosis not present

## 2022-02-20 DIAGNOSIS — Z8249 Family history of ischemic heart disease and other diseases of the circulatory system: Secondary | ICD-10-CM | POA: Diagnosis not present

## 2022-02-20 DIAGNOSIS — I447 Left bundle-branch block, unspecified: Secondary | ICD-10-CM

## 2022-02-20 DIAGNOSIS — E782 Mixed hyperlipidemia: Secondary | ICD-10-CM

## 2022-02-20 DIAGNOSIS — G250 Essential tremor: Secondary | ICD-10-CM

## 2022-02-20 DIAGNOSIS — R5383 Other fatigue: Secondary | ICD-10-CM

## 2022-02-20 DIAGNOSIS — I10 Essential (primary) hypertension: Secondary | ICD-10-CM | POA: Diagnosis not present

## 2022-02-20 DIAGNOSIS — Z1211 Encounter for screening for malignant neoplasm of colon: Secondary | ICD-10-CM

## 2022-02-20 DIAGNOSIS — Z0001 Encounter for general adult medical examination with abnormal findings: Secondary | ICD-10-CM

## 2022-02-20 DIAGNOSIS — E039 Hypothyroidism, unspecified: Secondary | ICD-10-CM

## 2022-02-20 NOTE — Patient Instructions (Signed)
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
## 2022-02-20 NOTE — Progress Notes (Signed)
Aorta Scan <3 

## 2022-02-21 LAB — IRON, TOTAL/TOTAL IRON BINDING CAP
%SAT: 49 % (calc) — ABNORMAL HIGH (ref 20–48)
Iron: 175 ug/dL (ref 50–180)
TIBC: 358 mcg/dL (calc) (ref 250–425)

## 2022-02-21 LAB — COMPLETE METABOLIC PANEL WITH GFR
AG Ratio: 2 (calc) (ref 1.0–2.5)
ALT: 26 U/L (ref 9–46)
AST: 17 U/L (ref 10–35)
Albumin: 4.7 g/dL (ref 3.6–5.1)
Alkaline phosphatase (APISO): 92 U/L (ref 35–144)
BUN: 15 mg/dL (ref 7–25)
CO2: 28 mmol/L (ref 20–32)
Calcium: 9.9 mg/dL (ref 8.6–10.3)
Chloride: 101 mmol/L (ref 98–110)
Creat: 1.03 mg/dL (ref 0.70–1.35)
Globulin: 2.4 g/dL (calc) (ref 1.9–3.7)
Glucose, Bld: 86 mg/dL (ref 65–99)
Potassium: 4.2 mmol/L (ref 3.5–5.3)
Sodium: 137 mmol/L (ref 135–146)
Total Bilirubin: 0.6 mg/dL (ref 0.2–1.2)
Total Protein: 7.1 g/dL (ref 6.1–8.1)
eGFR: 81 mL/min/{1.73_m2} (ref >=60)

## 2022-02-21 LAB — CBC WITH DIFFERENTIAL/PLATELET
Absolute Monocytes: 583 cells/uL (ref 200–950)
Basophils Absolute: 37 cells/uL (ref 0–200)
Basophils Relative: 0.7 %
Eosinophils Absolute: 127 cells/uL (ref 15–500)
Eosinophils Relative: 2.4 %
HCT: 47.3 % (ref 38.5–50.0)
Hemoglobin: 16.2 g/dL (ref 13.2–17.1)
Lymphs Abs: 2083 cells/uL (ref 850–3900)
MCH: 30.9 pg (ref 27.0–33.0)
MCHC: 34.2 g/dL (ref 32.0–36.0)
MCV: 90.3 fL (ref 80.0–100.0)
MPV: 10.7 fL (ref 7.5–12.5)
Monocytes Relative: 11 %
Neutro Abs: 2470 cells/uL (ref 1500–7800)
Neutrophils Relative %: 46.6 %
Platelets: 210 10*3/uL (ref 140–400)
RBC: 5.24 10*6/uL (ref 4.20–5.80)
RDW: 13.5 % (ref 11.0–15.0)
Total Lymphocyte: 39.3 %
WBC: 5.3 10*3/uL (ref 3.8–10.8)

## 2022-02-21 LAB — LIPID PANEL
Cholesterol: 251 mg/dL — ABNORMAL HIGH (ref <200)
HDL: 55 mg/dL (ref >=40)
LDL Cholesterol (Calc): 158 mg/dL (calc) — ABNORMAL HIGH
Non-HDL Cholesterol (Calc): 196 mg/dL (calc) — ABNORMAL HIGH (ref <130)
Total CHOL/HDL Ratio: 4.6 (calc) (ref <5.0)
Triglycerides: 215 mg/dL — ABNORMAL HIGH (ref <150)

## 2022-02-21 LAB — TSH: TSH: 1.72 mIU/L (ref 0.40–4.50)

## 2022-02-21 LAB — URINALYSIS, ROUTINE W REFLEX MICROSCOPIC
Bacteria, UA: NONE SEEN /HPF
Bilirubin Urine: NEGATIVE
Glucose, UA: NEGATIVE
Hgb urine dipstick: NEGATIVE
Hyaline Cast: NONE SEEN /LPF
Ketones, ur: NEGATIVE
Leukocytes,Ua: NEGATIVE
Nitrite: NEGATIVE
RBC / HPF: NONE SEEN /HPF (ref 0–2)
Specific Gravity, Urine: 1.015 (ref 1.001–1.035)
Squamous Epithelial / HPF: NONE SEEN /HPF (ref <=5)
WBC, UA: NONE SEEN /HPF (ref 0–5)
pH: 5.5 (ref 5.0–8.0)

## 2022-02-21 LAB — MAGNESIUM: Magnesium: 2.1 mg/dL (ref 1.5–2.5)

## 2022-02-21 LAB — VITAMIN B12: Vitamin B-12: 360 pg/mL (ref 200–1100)

## 2022-02-21 LAB — VITAMIN D 25 HYDROXY (VIT D DEFICIENCY, FRACTURES): Vit D, 25-Hydroxy: 46 ng/mL (ref 30–100)

## 2022-02-21 LAB — PSA: PSA: 1.86 ng/mL (ref <=4.00)

## 2022-02-21 LAB — HEMOGLOBIN A1C
Hgb A1c MFr Bld: 5.4 % of total Hgb (ref <5.7)
Mean Plasma Glucose: 108 mg/dL
eAG (mmol/L): 6 mmol/L

## 2022-02-21 LAB — MICROALBUMIN / CREATININE URINE RATIO
Creatinine, Urine: 119 mg/dL (ref 20–320)
Microalb Creat Ratio: 142 mcg/mg creat — ABNORMAL HIGH (ref <30)
Microalb, Ur: 16.9 mg/dL

## 2022-02-21 LAB — INSULIN, RANDOM: Insulin: 12.9 u[IU]/mL

## 2022-02-21 LAB — MICROSCOPIC MESSAGE

## 2022-02-21 LAB — TESTOSTERONE: Testosterone: 340 ng/dL (ref 250–827)

## 2022-02-21 NOTE — Progress Notes (Signed)
<><><><><><><><><><><><><><><><><><><><><><><><><><><><><><><><><> ?<><><><><><><><><><><><><><><><><><><><><><><><><><><><><><><><><> ?- Test results slightly outside the reference range are not unusual. ?If there is anything important, I will review this with you,  ?otherwise it is considered normal test values.  ?If you have further questions,  ?please do not hesitate to contact me at the office or via My Chart.  ?<><><><><><><><><><><><><><><><><><><><><><><><><><><><><><><><><> ?<><><><><><><><><><><><><><><><><><><><><><><><><><><><><><><><><> ? ?-  Iron levels  - OK  ?<><><><><><><><><><><><><><><><><><><><><><><><><><><><><><><><><> ? ?-   ?-  Vitamin B12 =     360   is     STILL     Very,  Very Low  ?(Ideal or Goal Vit B12 is between 450 - 1,100)  ? ?Low Vit B12 may be associated with Anemia , Fatigue,  ? ?Peripheral Neuropathy, Impotence, Dementia, "Brain Fog", & Depression ? ?- Recommend take a sub-lingual form of Vitamin B12 tablet  ? ?1,000 to 5,000 mcg tab that you dissolve under your tongue /Daily  ? ?- Can get Lavonia Dana - best price at [employer] or on [employer] ?<><><><><><><><><><><><><><><><><><><><><><><><><><><><><><><><><> ? ?-  PSA - Low  - Great  ?<><><><><><><><><><><><><><><><><><><><><><><><><><><><><><><><><> ?? ?CHOLESTEROL MUCH WORSE  -  ARE YOU TAKING YOUR ROSUVASTATIN  ? ? ?- Total Chol = ?251? is very high risk for Heart Attack /Stroke /Vascular Dementia ??? ? ? ? ( ?Ideal or Goal is less than 180 ?! ?)  ?& ?- Bad /Dangerous LDL Chol = 158  - - >> Sitting on a time Bomb ! ??? ? ? ? ?( ?Ideal or Goal is less than 70 ?! ?)  ?? ?- Treating with meds to lower Cholesterol is treating the result  ??? ? ?? ? ? ? ? ? ? ? ? ? ? ? ? ? ? ? ? ? ? ? ? ? ? ? ? ? ? ? ? ? ? ? ? ? ? ? ?  & NOT ?treating the cause ? ? ?- The cause is Bad Diet ! ?? ?- Read or listen to ? ? ?Dr Glenard Haring 's book ? ? ? " How Not to Die ! "  ?? ?- Recommend ?a stricter plant based low cholesterol diet  ? ?-  Cholesterol only comes from animal sources  ?- ie. meat, dairy, egg yolks ? ?- Eat all the vegetables you want. ? ?- Avoid Meat, Avoid Meat, Avoid Meat - especially red meat - Beef AND Pork  ? ?- Avoid cheese & dairy - milk & ice cream. ?  ? ?- Cheese is the most concentrated form of trans-fats which  ?is the worst thing to clog up our arteries.  ? ?- Veggie cheese is OK which can be found in the fresh  ?produce section at [employer] or [employer] or [employer] ?<><><><><><><><><><><><><><><><><><><><><><><><><><><><><><><><><> ? ?-  Testosterone  - Level is OK - in Normal range  ?<><><><><><><><><><><><><><><><><><><><><><><><><><><><><><><><><> ? ?-  A1c - Normal - No Diabetes   - Great  ! ?<><><><><><><><><><><><><><><><><><><><><><><><><><><><><><><><><> ? ?-  Vit D = 46 - is very LOW  ?- Vitamin D goal is between 70-100.  ? ?- Please INCREASE  your Vitamin D 5,000 units to 2 capsules = 10,000 units /daily  ? ?- It is very important as a natural anti-inflammatory and helping the  ?immune system protect against viral infections, like the Covid-19  ? ? ?helping hair, skin, and nails, as well as reducing stroke and  ?heart attack risk.  ? ?- It helps your bones and helps with mood. ? ?- It also decreases numerous cancer risks so please  ?take it as directed.  ? ?-  Low Vit D is associated with a 200-300% higher risk for  ?CANCER  ? ?and 200-300% higher risk for HEART   ATTACK  &  STROKE.   ? ?- It is also associated with higher death rate at younger ages,  ? ?autoimmune diseases like Rheumatoid arthritis, Lupus,  ?Multiple Sclerosis.    ? ?- Also many other serious conditions, like depression, Alzheimer's ? ?Dementia, infertility, muscle aches, fatigue, fibromyalgia  ? ?- just to name a few. ?<><><><><><><><><><><><><><><><><><><><><><><><><><><><><><><><><> ? ?-  All Else - CBC - Kidneys - Electrolytes - Liver - Magnesium & Thyroid   ? ?- all  Normal /  OK ?<><><><><><><><><><><><><><><><><><><><><><><><><><><><><><><><><> ?<><><><><><><><><><><><><><><><><><><><><><><><><><><><><><><><><> ? ? ? ? ? ? ? ? ? ? ? ? ? ? ? ? ? ? ? ? ? ? ? ? ? ?

## 2022-02-27 ENCOUNTER — Other Ambulatory Visit: Payer: Self-pay | Admitting: Adult Health

## 2022-02-27 DIAGNOSIS — G250 Essential tremor: Secondary | ICD-10-CM

## 2022-02-27 DIAGNOSIS — I10 Essential (primary) hypertension: Secondary | ICD-10-CM

## 2022-03-30 ENCOUNTER — Other Ambulatory Visit: Payer: Self-pay | Admitting: Internal Medicine

## 2022-03-30 DIAGNOSIS — I10 Essential (primary) hypertension: Secondary | ICD-10-CM

## 2022-05-22 ENCOUNTER — Encounter: Payer: Self-pay | Admitting: Nurse Practitioner

## 2022-05-22 ENCOUNTER — Ambulatory Visit: Payer: 59 | Admitting: Nurse Practitioner

## 2022-05-22 VITALS — BP 158/82 | HR 53 | Temp 97.5°F | Ht 74.0 in | Wt 219.0 lb

## 2022-05-22 DIAGNOSIS — I10 Essential (primary) hypertension: Secondary | ICD-10-CM | POA: Diagnosis not present

## 2022-05-22 DIAGNOSIS — K4090 Unilateral inguinal hernia, without obstruction or gangrene, not specified as recurrent: Secondary | ICD-10-CM

## 2022-05-22 DIAGNOSIS — E559 Vitamin D deficiency, unspecified: Secondary | ICD-10-CM | POA: Diagnosis not present

## 2022-05-22 DIAGNOSIS — E782 Mixed hyperlipidemia: Secondary | ICD-10-CM | POA: Diagnosis not present

## 2022-05-22 DIAGNOSIS — R251 Tremor, unspecified: Secondary | ICD-10-CM

## 2022-05-22 DIAGNOSIS — L570 Actinic keratosis: Secondary | ICD-10-CM

## 2022-05-22 DIAGNOSIS — E039 Hypothyroidism, unspecified: Secondary | ICD-10-CM | POA: Diagnosis not present

## 2022-05-22 NOTE — Progress Notes (Signed)
Assessment and Plan:   Hypertension:  Above goal in clinic. Asymptomatic. Continue medications;  Discussed DASH (Dietary Approaches to Stop Hypertension) DASH diet is lower in sodium than a typical American diet. Cut back on foods that are high in saturated fat, cholesterol, and trans fats. Eat more whole-grain foods, fish, poultry, and nuts Remain active and exercise as tolerated daily.  Monitor BP at home-Call if greater than 130/80.    Cholesterol: Continue medications;  Discussed lifestyle modifications. Recommended diet heavy in fruits and veggies, omega 3's. Decrease consumption of animal meats, cheeses, and dairy products. Remain active and exercise as tolerated. Continue to monitor.  Vitamin D Def: At goal. Continue supplement Check Vitamin D   Hypothyroidism Controlled. Continue Levothyroxine. Reminded to take on an empty stomach 30-22mins before food.  Stop any Biotin Supplement 48-72 hours before next TSH level to reduce the risk of falsely low TSH levels. Continue to monitor.    Actinic keratoses Several extensive areas Advised schedule appointment for cryo or follow up derm if not resolving; protect skin from sun  Tremor Not worsening. Continue Propranolol Rarely Takes Valium  Hernia Right groin. Refer to general surgery.   Orders Placed This Encounter  Procedures   Ambulatory referral to General Surgery    Referral Priority:   Routine    Referral Type:   Surgical    Referral Reason:   Specialty Services Required    Requested Specialty:   General Surgery    Number of Visits Requested:   1      Further disposition pending results of labs. Discussed med's effects and SE's.    Over 20 minutes of exam, counseling, chart review, and critical decision making was performed.    Future Appointments  Date Time Provider Department Center  08/23/2022  3:30 PM Gerald Cowboy, MD [facility] None  02/25/2023  2:00 PM Gerald Cowboy, MD [facility]  None    HPI 66 y.o. male  presents for 3 month follow up with hypertension, hyperlipidemia, prediabetes, hypothyroid and vitamin D.  Overall he reports feeling well today.  Reports having a right hernia in the past that was repaired.  He is requesting to have this reassess d/t increa in pain and discomfort.    He takes propranolol and PRN valium for essential tremor.   He reports 2-3 months of R anterior hip pain, some unsteady sensation with sitting to sanding, intermittent, will get worse after walking 200 yards, climbing stairs; taking tylenol with improvement but overall progressive/not resolving. Doesn't note stiffness.   BMI is Body mass index is 28.12 kg/m., he has been working on diet and exercise. More sedentary since retirement other than active with kids.   Wt Readings from Last 3 Encounters:  05/22/22 219 lb (99.3 kg)  02/20/22 219 lb 12.8 oz (99.7 kg)  08/30/21 217 lb 3.2 oz (98.5 kg)    His blood pressure has been controlled at home (120s/70), today their BP is BP: (!) 158/82.  He does workout. He denies chest pain, dizziness. Mildly increased dyspne   He is on cholesterol medication, crestor 20 mg and denies myalgias.  Tries to take daily. His cholesterol is at goal. The cholesterol last visit was:   Lab Results  Component Value Date   CHOL 251 (H) 02/20/2022   HDL 55 02/20/2022   LDLCALC 158 (H) 02/20/2022   TRIG 215 (H) 02/20/2022   CHOLHDL 4.6 02/20/2022    He has been working on diet and exercise for hx of prediabetes (A1C 5.7% in  2015), and denies foot ulcerations, hyperglycemia, hypoglycemia , increased appetite, nausea, paresthesia of the feet, polydipsia, polyuria, visual disturbances, vomiting and weight loss.  Last A1C in the office was:  Lab Results  Component Value Date   HGBA1C 5.4 02/20/2022   Patient is on Vitamin D supplement, taking 5000 IU, no dose changes Lab Results  Component Value Date   VD25OH 59 02/20/2022     He is on thyroid  medication for hypothyroid since 2012. His medication was not changed last visit.  Taking 50 mcg.  Lab Results  Component Value Date   TSH 1.72 02/20/2022     Current Medications:  Current Outpatient Medications on File Prior to Visit  Medication Sig Dispense Refill   aspirin 81 MG tablet Take 81 mg by mouth daily.     Cholecalciferol (VITAMIN D PO) Take 5,000 Units by mouth daily.     Cyanocobalamin (B-12 SL) Place under the tongue daily.     diazepam (VALIUM) 5 MG tablet Take      1/2 to 1 tablet        3 x /day       for Tremor 90 tablet 0   levothyroxine (SYNTHROID) 50 MCG tablet TAKE 1 TABLET BY MOUTH DAILY ON EMPTY stomach (take with water ONLY wait 30 MINUTES, no calcium / magnesium FOR FOUR hours) 270 tablet 0   Magnesium 250 MG TABS Take 250 mg by mouth daily.     Multiple Vitamins-Minerals (MULTIVITAMIN PO) Take by mouth.     olmesartan (BENICAR) 40 MG tablet TAKE 1 TABLET BY MOUTH EVERY DAY FOR BLOOD PRESSURE 90 tablet 3   propranolol ER (INDERAL LA) 120 MG 24 hr capsule TAKE 1 CAPSULE BY MOUTH EVERY DAY 90 capsule 0   rosuvastatin (CRESTOR) 20 MG tablet Take  1 tablet  Daily for Cholesterol                                                   /                             TAKE                                              BYMOUTH 90 tablet 3   zinc gluconate 50 MG tablet Take 50 mg by mouth daily.     No current facility-administered medications on file prior to visit.   Allergies:  Allergies  Allergen Reactions   Simvastatin    Iodine Rash    Medical History:  Past Medical History:  Diagnosis Date   Hyperlipidemia    Hypertension    LBBB (left bundle branch block)    Prediabetes    Thyroid disease    HYPO   Varicose veins    Vitamin D deficiency     Allergies:  Allergies  Allergen Reactions   Simvastatin    Iodine Rash     Review of Systems:  Review of Systems  Constitutional:  Negative for malaise/fatigue and weight loss.  HENT:  Negative for hearing  loss and tinnitus.   Eyes:  Negative for blurred vision and double vision.  Respiratory:  Negative  for cough, shortness of breath and wheezing.   Cardiovascular:  Negative for chest pain, palpitations, orthopnea, claudication and leg swelling.  Gastrointestinal:  Negative for abdominal pain, blood in stool, constipation, diarrhea, heartburn, melena, nausea and vomiting.  Genitourinary: Negative.   Musculoskeletal:  Positive for joint pain (R hip/groin, anterior). Negative for back pain and myalgias.  Skin:  Negative for rash.  Neurological:  Negative for dizziness, tingling, sensory change, weakness and headaches.  Endo/Heme/Allergies:  Negative for polydipsia.  Psychiatric/Behavioral: Negative.    All other systems reviewed and are negative.   Family history- Review and unchanged  Social history- Review and unchanged  Physical Exam: BP (!) 158/82   Pulse (!) 53   Temp (!) 97.5 F (36.4 C)   Ht 6\' 2"  (1.88 m)   Wt 219 lb (99.3 kg)   SpO2 96%   BMI 28.12 kg/m  Wt Readings from Last 3 Encounters:  05/22/22 219 lb (99.3 kg)  02/20/22 219 lb 12.8 oz (99.7 kg)  08/30/21 217 lb 3.2 oz (98.5 kg)    General Appearance: Well nourished well developed, in no apparent distress. Eyes: PERRLA, EOMs, conjunctiva no swelling or erythema ENT/Mouth: Ear canals normal without obstruction, swelling, erythma, discharge.  TMs normal bilaterally.  Oropharynx moist, clear, without exudate, or postoropharyngeal swelling. Neck: Supple, thyroid normal,no cervical adenopathy  Respiratory: Respiratory effort normal, Breath sounds clear A&P without rhonchi, wheeze, or rale.  No retractions, no accessory usage. Cardio: RRR with no MRGs. Brisk peripheral pulses without edema.  Abdomen: Soft, + BS,  Non tender, no guarding, rebound, hernias, masses. Musculoskeletal: Full ROM, 5/5 strength lumbar and bil hips, tender over anterior hip tendons at  Normal gait Skin: Warm, dry without rashes, ecchymosis. He has  several areas to face with erythematous base, crusty top.  Neuro: Awake and oriented X 3, Cranial nerves intact. Normal muscle tone, no cerebellar symptoms. Intention tremor and slight head tremor.  Psych: Normal affect, Insight and Judgment appropriate.    13/02/22, NP 4:07 PM [facility]

## 2022-05-31 ENCOUNTER — Other Ambulatory Visit: Payer: Self-pay | Admitting: Nurse Practitioner

## 2022-05-31 DIAGNOSIS — G250 Essential tremor: Secondary | ICD-10-CM

## 2022-05-31 DIAGNOSIS — I10 Essential (primary) hypertension: Secondary | ICD-10-CM

## 2022-08-23 ENCOUNTER — Ambulatory Visit: Payer: 59 | Admitting: Internal Medicine

## 2022-09-19 NOTE — Patient Instructions (Signed)
DUE TO COVID-19 ONLY TWO VISITORS  (aged 64 and older)  ARE ALLOWED TO COME WITH YOU AND STAY IN THE WAITING ROOM ONLY DURING PRE OP AND PROCEDURE.   **NO VISITORS ARE ALLOWED IN THE SHORT STAY AREA OR RECOVERY ROOM!!**  IF YOU WILL BE ADMITTED INTO THE HOSPITAL YOU ARE ALLOWED ONLY FOUR SUPPORT PEOPLE DURING VISITATION HOURS ONLY (7 AM -8PM)   The support person(s) must pass our screening, gel in and out, and wear a mask at all times, including in the patient's room. Patients must also wear a mask when staff or their support person are in the room. Visitors GUEST BADGE MUST BE WORN VISIBLY  One adult visitor may remain with you overnight and MUST be in the room by 8 P.M.     Your procedure is scheduled on: 10/12/22   Report to [facility] Main Entrance    Report to admitting at  5:15 AM   Call this number if you have problems the morning of surgery [phone]   Do not eat food or drink:After Midnight.       Oral Hygiene is also important to reduce your risk of infection.                                    Remember - BRUSH YOUR TEETH THE MORNING OF SURGERY WITH YOUR REGULAR TOOTHPASTE   Do NOT smoke after Midnight   Take these medicines the morning of surgery with A SIP OF WATER: Diazepam if needed                                                                                                                            Rosuvastatin                                                                                                                             Propranolol-Inderal  Levothyroxine-Synthroid                               You may not have any metal on your body including jewelry, and body piercing             Do not wear  lotions, powders, cologne, or deodorant                Men may shave face and neck.   Do not bring valuables to the  hospital. [facility] IS NOT             RESPONSIBLE   FOR VALUABLES.   Contacts, dentures or bridgework may not be worn into surgery.   DO NOT BRING YOUR HOME MEDICATIONS TO THE HOSPITAL.    Patients discharged on the day of surgery will not be allowed to drive home.  Someone NEEDS to stay with you for the first 24 hours after anesthesia.   Special Instructions: Bring a copy of your healthcare power of attorney and living will documents the day of surgery if you haven't scanned them before.              Please read over the following fact sheets you were given: IF YOU HAVE QUESTIONS ABOUT YOUR PRE-OP INSTRUCTIONS PLEASE CALL [phone]    [facility] - Preparing for Surgery Before surgery, you can play an important role.  Because skin is not sterile, your skin needs to be as free of germs as possible.  You can reduce the number of germs on your skin by washing with CHG (chlorahexidine gluconate) soap before surgery.  CHG is an antiseptic cleaner which kills germs and bonds with the skin to continue killing germs even after washing. Please DO NOT use if you have an allergy to CHG or antibacterial soaps.  If your skin becomes reddened/irritated stop using the CHG and inform your nurse when you arrive at Short Stay.  You may shave your face/neck. Please follow these instructions carefully:  1.  Shower with CHG Soap the night before surgery and the  morning of Surgery.  2.  If you choose to wash your hair, wash your hair first as usual with your  normal  shampoo.  3.  After you shampoo, rinse your hair and body thoroughly to remove the  shampoo.                            4.  Use CHG as you would any other liquid soap.  You can apply chg directly  to the skin and wash                       Gently with a scrungie or clean washcloth.  5.  Apply the CHG Soap to your body ONLY FROM THE NECK DOWN.   Do not use on face/ open                           Wound or open sores. Avoid contact with eyes,  ears mouth and genitals (private parts).                       Wash face,  Genitals (private parts) with your normal soap.             6.  Wash thoroughly,  paying special attention to the area where your surgery  will be performed.  7.  Thoroughly rinse your body with warm water from the neck down.  8.  DO NOT shower/wash with your normal soap after using and rinsing off  the CHG Soap.                9.  Pat yourself dry with a clean towel.            10.  Wear clean pajamas.            11.  Place clean sheets on your bed the night of your first shower and do not  sleep with pets. Day of Surgery : Do not apply any lotions/deodorants the morning of surgery.  Please wear clean clothes to the hospital/surgery center.  FAILURE TO FOLLOW THESE INSTRUCTIONS MAY RESULT IN THE CANCELLATION OF YOUR SURGERY    ________________________________________________________________________

## 2022-09-24 NOTE — Progress Notes (Signed)
Surgical orders requested via Epic inbox. 

## 2022-09-25 ENCOUNTER — Encounter: Payer: Self-pay | Admitting: Internal Medicine

## 2022-09-25 ENCOUNTER — Ambulatory Visit (INDEPENDENT_AMBULATORY_CARE_PROVIDER_SITE_OTHER): Payer: 59 | Admitting: Internal Medicine

## 2022-09-25 VITALS — BP 138/78 | HR 49 | Temp 97.5°F | Ht 74.0 in | Wt 221.2 lb

## 2022-09-25 DIAGNOSIS — I10 Essential (primary) hypertension: Secondary | ICD-10-CM

## 2022-09-25 DIAGNOSIS — E559 Vitamin D deficiency, unspecified: Secondary | ICD-10-CM | POA: Diagnosis not present

## 2022-09-25 DIAGNOSIS — E782 Mixed hyperlipidemia: Secondary | ICD-10-CM | POA: Diagnosis not present

## 2022-09-25 DIAGNOSIS — Z23 Encounter for immunization: Secondary | ICD-10-CM

## 2022-09-25 DIAGNOSIS — R7309 Other abnormal glucose: Secondary | ICD-10-CM

## 2022-09-25 DIAGNOSIS — G250 Essential tremor: Secondary | ICD-10-CM

## 2022-09-25 DIAGNOSIS — Z79899 Other long term (current) drug therapy: Secondary | ICD-10-CM

## 2022-09-25 DIAGNOSIS — R251 Tremor, unspecified: Secondary | ICD-10-CM

## 2022-09-25 DIAGNOSIS — E039 Hypothyroidism, unspecified: Secondary | ICD-10-CM

## 2022-09-25 MED ORDER — DIAZEPAM 5 MG PO TABS: ORAL_TABLET | ORAL | 1 refills | Status: DC

## 2022-09-25 NOTE — Patient Instructions (Signed)
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
## 2022-09-26 LAB — COMPLETE METABOLIC PANEL WITH GFR
AG Ratio: 1.6 (calc) (ref 1.0–2.5)
ALT: 27 U/L (ref 9–46)
AST: 19 U/L (ref 10–35)
Albumin: 4.7 g/dL (ref 3.6–5.1)
Alkaline phosphatase (APISO): 104 U/L (ref 35–144)
BUN: 14 mg/dL (ref 7–25)
CO2: 28 mmol/L (ref 20–32)
Calcium: 10 mg/dL (ref 8.6–10.3)
Chloride: 101 mmol/L (ref 98–110)
Creat: 1.05 mg/dL (ref 0.70–1.35)
Globulin: 3 g/dL (calc) (ref 1.9–3.7)
Glucose, Bld: 90 mg/dL (ref 65–99)
Potassium: 4.9 mmol/L (ref 3.5–5.3)
Sodium: 139 mmol/L (ref 135–146)
Total Bilirubin: 0.6 mg/dL (ref 0.2–1.2)
Total Protein: 7.7 g/dL (ref 6.1–8.1)
eGFR: 79 mL/min/{1.73_m2} (ref >=60)

## 2022-09-26 LAB — CBC WITH DIFFERENTIAL/PLATELET
Absolute Monocytes: 605 cells/uL (ref 200–950)
Basophils Absolute: 50 cells/uL (ref 0–200)
Basophils Relative: 0.9 %
Eosinophils Absolute: 129 cells/uL (ref 15–500)
Eosinophils Relative: 2.3 %
HCT: 46.7 % (ref 38.5–50.0)
Hemoglobin: 16.2 g/dL (ref 13.2–17.1)
Lymphs Abs: 1898 cells/uL (ref 850–3900)
MCH: 30.5 pg (ref 27.0–33.0)
MCHC: 34.7 g/dL (ref 32.0–36.0)
MCV: 87.9 fL (ref 80.0–100.0)
MPV: 10.5 fL (ref 7.5–12.5)
Monocytes Relative: 10.8 %
Neutro Abs: 2918 cells/uL (ref 1500–7800)
Neutrophils Relative %: 52.1 %
Platelets: 230 10*3/uL (ref 140–400)
RBC: 5.31 10*6/uL (ref 4.20–5.80)
RDW: 13.1 % (ref 11.0–15.0)
Total Lymphocyte: 33.9 %
WBC: 5.6 10*3/uL (ref 3.8–10.8)

## 2022-09-26 LAB — HEMOGLOBIN A1C
Hgb A1c MFr Bld: 5.7 % of total Hgb — ABNORMAL HIGH (ref <5.7)
Mean Plasma Glucose: 117 mg/dL
eAG (mmol/L): 6.5 mmol/L

## 2022-09-26 LAB — LIPID PANEL
Cholesterol: 208 mg/dL — ABNORMAL HIGH (ref <200)
HDL: 50 mg/dL (ref >=40)
LDL Cholesterol (Calc): 121 mg/dL (calc) — ABNORMAL HIGH
Non-HDL Cholesterol (Calc): 158 mg/dL (calc) — ABNORMAL HIGH (ref <130)
Total CHOL/HDL Ratio: 4.2 (calc) (ref <5.0)
Triglycerides: 241 mg/dL — ABNORMAL HIGH (ref <150)

## 2022-09-26 LAB — INSULIN, RANDOM: Insulin: 15.9 u[IU]/mL

## 2022-09-26 LAB — VITAMIN D 25 HYDROXY (VIT D DEFICIENCY, FRACTURES): Vit D, 25-Hydroxy: 45 ng/mL (ref 30–100)

## 2022-09-26 LAB — TSH: TSH: 1.91 mIU/L (ref 0.40–4.50)

## 2022-09-26 LAB — MAGNESIUM: Magnesium: 2.3 mg/dL (ref 1.5–2.5)

## 2022-09-26 NOTE — Progress Notes (Signed)
Second request: Sent message, via epic in basket, requesting orders in epic from surgeon.  

## 2022-09-27 NOTE — Progress Notes (Signed)
<><><><><><><><><><><><><><><><><><><><><><><><><><><><><><><><><> <><><><><><><><><><><><><><><><><><><><><><><><><><><><><><><><><>  -  Total  Chol =   208      - Still  Elevated             (  Ideal  or  Goal is less than 180  !  )  & -  Bad / Dangerous LDL  Chol =   121  - also Very Elevated              (  Ideal  or  Goal is less than 70  !  )    - Are you still taking Rosuvastatin EVERY day  ? ? ?    - Also Diet is very Important   Your blood sugar and A1c are elevated.    Being diabetic has a  300% increased risk for heart attack,                                                       stroke, cancer, and alzheimer- type vascular dementia  It is very important that you work harder with diet by                                        avoiding all foods that are white except chicken, fish & cauliflower  - Avoid white rice  (brown & wild rice is OK),   - Avoid white potatoes  (sweet potatoes in moderation is OK),   White bread or wheat bread or anything made out of   white flour like bagels, donuts, rolls, buns, biscuits, cakes,  - pastries, cookies, pizza crust, and pasta (made from  white flour & egg whites)   - vegetarian pasta or spinach or wheat pasta is OK.  - Multigrain breads like Arnold's, Pepperidge Farm or   multigrain sandwich thins or high fiber breads like   Eureka bread or "Dave's Killer" breads that are  4 to 5 grams fiber per slice !  are best.    Diet, exercise and weight loss can reverse and cure                                                                                        diabetes in the early stages.    - Diet, exercise and weight loss is very important in the control and prevention of  complications of diabetes which    affects every system in your body, ie.   - Brain - dementia/stroke,  - eyes - glaucoma/blindness,  - heart - heart attack/heart failure,  - kidneys - dialysis,  - stomach - gastric paralysis,  - intestines -  malabsorption,  - nerves - severe painful neuritis,  - circulation - gangrene & loss of a leg(s)  - and finally  . . . . . . . . . . . . . . . . . .    - cancer and  Alzheimers <><><><><><><><><><><><><><><><><><><><><><><><><><><><><><><><><> <><><><><><><><><><><><><><><><><><><><><><><><><><><><><><><><><>  -  Also Triglycerides (    241   ) or fats in blood are too high                 (   Ideal or  Goal is less than 150  !  )    - Recommend avoid fried & greasy foods,  sweets / candy,   - Avoid white rice  (brown or wild rice or Quinoa is OK),   - Avoid white potatoes  (sweet potatoes are OK)   - Avoid anything made from white flour  - bagels, doughnuts, rolls, buns, biscuits, white and   wheat breads, pizza crust and traditional  pasta made of white flour & egg white  - (vegetarian pasta or spinach or wheat pasta is OK).    - Multi-grain bread is OK - like multi-grain flat bread or  sandwich thins.   - Avoid alcohol in excess.   - Exercise is also important. <><><><><><><><><><><><><><><><><><><><><><><><><><><><><><><><><> <><><><><><><><><><><><><><><><><><><><><><><><><><><><><><><><><>  -  A1c = 5.7%  - Blood sugar and A1c are  Now  elevated in the borderline and                              early or pre-diabetes range which has the same   300% increased risk for heart attack, stroke, cancer and                                     alzheimer- type vascular dementia as full blown diabetes.   But the good news is that diet, exercise with weight loss can                                                                         cure the early diabetes at this point. <><><><><><><><><><><><><><><><><><><><><><><><><><><><><><><><><> <><><><><><><><><><><><><><><><><><><><><><><><><><><><><><><><><>  -  Vitamin D = 45  - is very Low   - Vitamin D goal is between 70-100.   - Please INCREASE your Vitamin D 5,000 u caps to                                                                                       2 capsules = 10,000 units /day   - It is very important as a natural anti-inflammatory and helping the  immune system protect against viral infections, like the Covid-19    helping hair, skin, and nails, as well as reducing stroke and heart attack risk.   - It helps your bones and helps with mood.  - It also decreases numerous cancer risks so please  take it as directed.   - Low Vit D is associated with a 200-300% higher risk for  CANCER   and 200-300% higher risk for HEART   ATTACK  &  STROKE.    - It is also associated with higher death rate at younger ages,   autoimmune diseases like Rheumatoid arthritis, Lupus, Multiple Sclerosis.     - Also many other serious conditions, like depression, Alzheimer's  Dementia,  muscle aches, fatigue, fibromyalgia   <><><><><><><><><><><><><><><><><><><><><><><><><><><><><><><><><> <><><><><><><><><><><><><><><><><><><><><><><><><><><><><><><><><>  -  All Else - CBC - Kidneys - Electrolytes - Liver - Magnesium & Thyroid    - all  Normal / OK <><><><><><><><><><><><><><><><><><><><><><><><><><><><><><><><><> <><><><><><><><><><><><><><><><><><><><><><><><><><><><><><><><><>

## 2022-09-28 ENCOUNTER — Encounter (HOSPITAL_COMMUNITY): Payer: Self-pay

## 2022-09-28 ENCOUNTER — Other Ambulatory Visit: Payer: Self-pay

## 2022-09-28 ENCOUNTER — Encounter (HOSPITAL_COMMUNITY)
Admission: RE | Admit: 2022-09-28 | Discharge: 2022-09-28 | Disposition: A | Discharge status: Home / Self Care | Payer: 59 | Source: Ambulatory Visit | Attending: Surgery | Admitting: Surgery

## 2022-09-28 DIAGNOSIS — Z01812 Encounter for preprocedural laboratory examination: Secondary | ICD-10-CM | POA: Diagnosis present

## 2022-09-28 DIAGNOSIS — I10 Essential (primary) hypertension: Secondary | ICD-10-CM | POA: Insufficient documentation

## 2022-09-28 HISTORY — DX: Unspecified osteoarthritis, unspecified site: M19.90

## 2022-09-28 HISTORY — DX: Hypothyroidism, unspecified: E03.9

## 2022-09-28 LAB — CBC
HCT: 46.9 % (ref 39.0–52.0)
Hemoglobin: 16.2 g/dL (ref 13.0–17.0)
MCH: 31 pg (ref 26.0–34.0)
MCHC: 34.5 g/dL (ref 30.0–36.0)
MCV: 89.8 fL (ref 80.0–100.0)
Platelets: 221 10*3/uL (ref 150–400)
RBC: 5.22 MIL/uL (ref 4.22–5.81)
RDW: 12.8 % (ref 11.5–15.5)
WBC: 6 10*3/uL (ref 4.0–10.5)
nRBC: 0 % (ref 0.0–0.2)

## 2022-09-28 LAB — BASIC METABOLIC PANEL
Anion gap: 8 (ref 5–15)
BUN: 15 mg/dL (ref 8–23)
CO2: 29 mmol/L (ref 22–32)
Calcium: 9.4 mg/dL (ref 8.9–10.3)
Chloride: 102 mmol/L (ref 98–111)
Creatinine, Ser: 0.93 mg/dL (ref 0.61–1.24)
GFR, Estimated: >60 mL/min (ref >60)
Glucose, Bld: 124 mg/dL — ABNORMAL HIGH (ref 70–99)
Potassium: 4.4 mmol/L (ref 3.5–5.1)
Sodium: 139 mmol/L (ref 135–145)

## 2022-09-28 NOTE — Progress Notes (Signed)
Anesthesia note:  Bowel prep reminder:  no  PCP - Dr. Nelva Nay McKe[place]in Cardiologist -none Other-   Chest x-ray -no  EKG - 02/20/22-epic Stress Test - no ECHO - 2017 Cardiac Cath - 2010- for military physical CABG-no Pacemaker/ICD device last checked:NA  Sleep Study - no CPAP -   Pt is pre diabetic-no Pt denies CBG at PAT visit- Fasting Blood Sugar at home- Checks Blood Sugar _____  Blood Thinner:no Blood Thinner Instructions: Aspirin Instructions: Last Dose:  Anesthesia review: no  Patient denies shortness of breath, fever, cough and chest pain at PAT appointment Pt has no SOB with activities.  Patient verbalized understanding of instructions that were given to them at the PAT appointment. Patient was also instructed that they will need to review over the PAT instructions again at home before surgery.yes

## 2022-09-29 ENCOUNTER — Encounter: Payer: Self-pay | Admitting: Internal Medicine

## 2022-10-02 ENCOUNTER — Ambulatory Visit: Payer: Self-pay | Admitting: Surgery

## 2022-10-04 ENCOUNTER — Other Ambulatory Visit: Payer: Self-pay | Admitting: Nurse Practitioner

## 2022-10-04 DIAGNOSIS — I10 Essential (primary) hypertension: Secondary | ICD-10-CM

## 2022-10-04 DIAGNOSIS — G250 Essential tremor: Secondary | ICD-10-CM

## 2022-10-11 NOTE — Anesthesia Preprocedure Evaluation (Addendum)
Anesthesia Evaluation  Patient identified by MRN, date of birth, ID band Patient awake    Reviewed: Allergy & Precautions, NPO status , Patient's Chart, lab work & pertinent test results, reviewed documented beta blocker date and time   History of Anesthesia Complications Negative for: history of anesthetic complications  Airway Mallampati: II  TM Distance: >3 FB Neck ROM: Full    Dental  (+) Dental Advisory Given   Pulmonary neg pulmonary ROS   breath sounds clear to auscultation       Cardiovascular hypertension, Pt. on medications and Pt. on home beta blockers (-) angina  Rhythm:Regular Rate:Normal  '15 ECHO: EF 50-55%, mild LVH, Grade 1 DD, no significant valvular abnormalities   Neuro/Psych  PSYCHIATRIC DISORDERS (PTSD)      negative neurological ROS     GI/Hepatic negative GI ROS, Neg liver ROS,,,  Endo/Other  Hypothyroidism    Renal/GU negative Renal ROS     Musculoskeletal  (+) Arthritis ,    Abdominal   Peds  Hematology negative hematology ROS (+)   Anesthesia Other Findings   Reproductive/Obstetrics                              Anesthesia Physical Anesthesia Plan  ASA: 2  Anesthesia Plan: General   Post-op Pain Management: Tylenol PO (pre-op)*   Induction: Intravenous  PONV Risk Score and Plan: 2 and Ondansetron and Dexamethasone  Airway Management Planned: Oral ETT  Additional Equipment: None  Intra-op Plan:   Post-operative Plan: Extubation in OR  Informed Consent: I have reviewed the patients History and Physical, chart, labs and discussed the procedure including the risks, benefits and alternatives for the proposed anesthesia with the patient or authorized representative who has indicated his/her understanding and acceptance.     Dental advisory given  Plan Discussed with: CRNA and Surgeon  Anesthesia Plan Comments:          Anesthesia Quick  Evaluation

## 2022-10-11 NOTE — H&P (Signed)
REFERRING PHYSICIAN:  Nadean Corwin,*   PROVIDER:  Katha Cabal, MD   MRN: [id number] DOB: 1956/02/09 DATE OF ENCOUNTER: 08/15/2022   Subjective    Chief Complaint: New Consultation [facility] repair)       History of Present Illness: Gerald [name] is a 66 y.o. male who is seen today as an office consultation at the request of Dr. Oneta Rack for evaluation of New Consultation [facility] repair) .     Mr. 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  He is recently been noticing more of a bulge and discomfort on the left side and has a new left inguinal hernia.  He has no prior abdominal surgery.  He has had vasectomies and the open right inguinal hernia.     Review of Systems: See HPI as well for other ROS.   ROS      Medical History: Past Medical History      Past Medical History:  Diagnosis Date   Hyperlipidemia     Hypertension     Thyroid disease             Patient Active Problem List  Diagnosis   Adjustment reaction   Benign essential hypertension   BMI 27.0-27.9,adult   Electrocardiogram abnormal   Essential tremor   Hypertension   Hypothyroid   LBBB (left bundle branch block)   Mixed hyperlipidemia   Osteoarthritis   Panic disorder   Shellfish allergy   Tremor   Varicose veins of bilateral lower extremities with other complications   Vitamin D deficiency      Past Surgical History       Past Surgical History:  Procedure Laterality Date   INGUINAL HERNIA REPAIR Right 2001        Allergies  No Known Allergies           Current Outpatient Medications on File Prior to Visit  Medication Sig Dispense Refill   levothyroxine (SYNTHROID) 50 MCG tablet TAKE 1 TABLET BY MOUTH EVERY DAY ON EMTPY STOMACH. TAKE WITH WATER ONLY WAIT 30 MINS, NO CALCIUM/MAGNESIUM FOR 4 HOURS.       olmesartan (BENICAR) 40 MG tablet Take 1 tablet by mouth once daily       propranoloL  (INDERAL LA) 120 MG 24 hr capsule Take 1 capsule by mouth once daily       rosuvastatin (CRESTOR) 20 MG tablet          No current facility-administered medications on file prior to visit.      Family History       Family History  Problem Relation Age of Onset   High blood pressure (Hypertension) Mother     Breast cancer Mother     Stroke Father     High blood pressure (Hypertension) Father          Social History        Tobacco Use  Smoking Status Former   Types: Cigarettes  Smokeless Tobacco Never      Social History  Social History         Socioeconomic History   Marital status: Married  Tobacco Use   Smoking status: Former      Types: Cigarettes   Smokeless tobacco: Never  Vaping Use   Vaping Use: Never used  Substance and Sexual Activity   Alcohol use: Yes   Drug use: Never  Objective:         Vitals:    08/15/22 1343  BP: 130/82  Pulse: 52  SpO2: 97%  Weight: 98.6 kg (217 lb 6.4 oz)  Height: 185.4 cm (6\' 1" )    Body mass index is 28.68 kg/m.   Physical Exam General: Well-maintained white male no acute distress.  He does have a tremor on the right side but this was not noticeable during the course of our visit HEENT  : Unremarkable Chest: Clear Heart: Sinus rhythm with no murmurs. Breast: Not examined Abdomen: Nontender GU new left inguinal hernia.  Testes without masses.  No recurrent right inguinal hernia Rectal not performed Extremities full range of motion Neuro alert and oriented x3.  Motor and sensory function grossly intact         Labs, Imaging and Diagnostic Testing: Nothing to review   Assessment and Plan:  Diagnoses and all orders for this visit:   Left inguinal hernia       I have discussed robotic left inguinal hernia with him using mesh.  He understands the procedure somewhat and wants to move forward hopefully getting him done before deer season ends.   Return for postop follow up.   Carliss Porcaro Donia Pounds, MD

## 2022-10-12 ENCOUNTER — Encounter (HOSPITAL_COMMUNITY)
Admission: RE | Disposition: A | Discharge status: Home / Self Care | Payer: Self-pay | Source: Ambulatory Visit | Attending: Surgery

## 2022-10-12 ENCOUNTER — Ambulatory Visit (HOSPITAL_BASED_OUTPATIENT_CLINIC_OR_DEPARTMENT_OTHER): Payer: 59 | Admitting: Anesthesiology

## 2022-10-12 ENCOUNTER — Ambulatory Visit (HOSPITAL_COMMUNITY): Payer: 59 | Admitting: Physician Assistant

## 2022-10-12 ENCOUNTER — Ambulatory Visit (HOSPITAL_COMMUNITY)
Admission: RE | Admit: 2022-10-12 | Discharge: 2022-10-12 | Disposition: A | Discharge status: Home / Self Care | Payer: 59 | Source: Ambulatory Visit | Attending: Surgery | Admitting: Surgery

## 2022-10-12 ENCOUNTER — Other Ambulatory Visit: Payer: Self-pay

## 2022-10-12 ENCOUNTER — Encounter (HOSPITAL_COMMUNITY): Payer: Self-pay | Admitting: Surgery

## 2022-10-12 DIAGNOSIS — I10 Essential (primary) hypertension: Secondary | ICD-10-CM | POA: Insufficient documentation

## 2022-10-12 DIAGNOSIS — K4090 Unilateral inguinal hernia, without obstruction or gangrene, not specified as recurrent: Secondary | ICD-10-CM | POA: Insufficient documentation

## 2022-10-12 DIAGNOSIS — E782 Mixed hyperlipidemia: Secondary | ICD-10-CM | POA: Insufficient documentation

## 2022-10-12 DIAGNOSIS — M1990 Unspecified osteoarthritis, unspecified site: Secondary | ICD-10-CM | POA: Diagnosis not present

## 2022-10-12 DIAGNOSIS — E039 Hypothyroidism, unspecified: Secondary | ICD-10-CM | POA: Insufficient documentation

## 2022-10-12 DIAGNOSIS — R7303 Prediabetes: Secondary | ICD-10-CM

## 2022-10-12 HISTORY — PX: XI ROBOTIC ASSISTED INGUINAL HERNIA REPAIR WITH MESH: SHX6706

## 2022-10-12 SURGERY — REPAIR, HERNIA, INGUINAL, ROBOT-ASSISTED, LAPAROSCOPIC, USING MESH
Anesthesia: General | Site: Inguinal | Laterality: Left

## 2022-10-12 MED ORDER — BUPIVACAINE LIPOSOME 1.3 % IJ SUSP
INTRAMUSCULAR | Status: DC | PRN
  Administered 2022-10-12: 20 mL

## 2022-10-12 MED ORDER — SUGAMMADEX SODIUM 200 MG/2ML IV SOLN
INTRAVENOUS | Status: DC | PRN
  Administered 2022-10-12: 200 mg via INTRAVENOUS

## 2022-10-12 MED ORDER — ONDANSETRON HCL 4 MG/2ML IJ SOLN
INTRAMUSCULAR | Status: DC | PRN
  Administered 2022-10-12: 4 mg via INTRAVENOUS

## 2022-10-12 MED ORDER — MIDAZOLAM HCL 2 MG/2ML IJ SOLN
INTRAMUSCULAR | Status: AC
  Filled 2022-10-12: qty 2

## 2022-10-12 MED ORDER — DEXMEDETOMIDINE HCL IN NACL 80 MCG/20ML IV SOLN
INTRAVENOUS | Status: AC
  Filled 2022-10-12: qty 20

## 2022-10-12 MED ORDER — MEPERIDINE HCL 50 MG/ML IJ SOLN: 6.2500 mg | INTRAMUSCULAR | Status: DC | PRN

## 2022-10-12 MED ORDER — SCOPOLAMINE 1 MG/3DAYS TD PT72
1.0000 | MEDICATED_PATCH | TRANSDERMAL | Status: DC
  Administered 2022-10-12: 1.5 mg via TRANSDERMAL
  Filled 2022-10-12: qty 1

## 2022-10-12 MED ORDER — CHLORHEXIDINE GLUCONATE 0.12 % MT SOLN
15.0000 mL | Freq: Once | OROMUCOSAL | Status: AC
  Administered 2022-10-12: 15 mL via OROMUCOSAL

## 2022-10-12 MED ORDER — BUPIVACAINE LIPOSOME 1.3 % IJ SUSP
INTRAMUSCULAR | Status: AC
  Filled 2022-10-12: qty 20

## 2022-10-12 MED ORDER — ROCURONIUM BROMIDE 10 MG/ML (PF) SYRINGE
PREFILLED_SYRINGE | INTRAVENOUS | Status: AC
  Filled 2022-10-12: qty 10

## 2022-10-12 MED ORDER — ONDANSETRON HCL 4 MG/2ML IJ SOLN
INTRAMUSCULAR | Status: AC
  Filled 2022-10-12: qty 2

## 2022-10-12 MED ORDER — LIDOCAINE HCL (CARDIAC) PF 100 MG/5ML IV SOSY
PREFILLED_SYRINGE | INTRAVENOUS | Status: DC | PRN
  Administered 2022-10-12: 60 mg via INTRAVENOUS

## 2022-10-12 MED ORDER — ACETAMINOPHEN 500 MG PO TABS: 1000.0000 mg | ORAL_TABLET | Freq: Once | ORAL | Status: DC

## 2022-10-12 MED ORDER — DEXAMETHASONE SODIUM PHOSPHATE 10 MG/ML IJ SOLN
INTRAMUSCULAR | Status: DC | PRN
  Administered 2022-10-12: 8 mg via INTRAVENOUS

## 2022-10-12 MED ORDER — LIDOCAINE HCL (PF) 2 % IJ SOLN
INTRAMUSCULAR | Status: AC
  Filled 2022-10-12: qty 5

## 2022-10-12 MED ORDER — DEXMEDETOMIDINE HCL IN NACL 80 MCG/20ML IV SOLN
INTRAVENOUS | Status: DC | PRN
  Administered 2022-10-12: 4 ug via BUCCAL
  Administered 2022-10-12: 8 ug via BUCCAL

## 2022-10-12 MED ORDER — MIDAZOLAM HCL 5 MG/5ML IJ SOLN
INTRAMUSCULAR | Status: DC | PRN
  Administered 2022-10-12 (×2): 1 mg via INTRAVENOUS

## 2022-10-12 MED ORDER — ACETAMINOPHEN 500 MG PO TABS
1000.0000 mg | ORAL_TABLET | ORAL | Status: AC
  Administered 2022-10-12: 1000 mg via ORAL
  Filled 2022-10-12: qty 2

## 2022-10-12 MED ORDER — PROPOFOL 10 MG/ML IV BOLUS
INTRAVENOUS | Status: AC
  Filled 2022-10-12: qty 20

## 2022-10-12 MED ORDER — FENTANYL CITRATE (PF) 100 MCG/2ML IJ SOLN
INTRAMUSCULAR | Status: DC | PRN
  Administered 2022-10-12 (×2): 50 ug via INTRAVENOUS
  Administered 2022-10-12: 25 ug via INTRAVENOUS
  Administered 2022-10-12: 50 ug via INTRAVENOUS
  Administered 2022-10-12: 25 ug via INTRAVENOUS
  Administered 2022-10-12: 50 ug via INTRAVENOUS

## 2022-10-12 MED ORDER — HYDROMORPHONE HCL 1 MG/ML IJ SOLN: 0.2500 mg | INTRAMUSCULAR | Status: DC | PRN

## 2022-10-12 MED ORDER — LIDOCAINE HCL (PF) 2 % IJ SOLN
INTRAMUSCULAR | Status: DC | PRN
  Administered 2022-10-12: 1.5 mg/kg/h via INTRADERMAL

## 2022-10-12 MED ORDER — PROPOFOL 10 MG/ML IV BOLUS
INTRAVENOUS | Status: DC | PRN
  Administered 2022-10-12: 10 mg via INTRAVENOUS
  Administered 2022-10-12: 150 mg via INTRAVENOUS

## 2022-10-12 MED ORDER — ORAL CARE MOUTH RINSE: 15.0000 mL | Freq: Once | OROMUCOSAL | Status: AC

## 2022-10-12 MED ORDER — OXYCODONE HCL 5 MG PO TABS: 5.0000 mg | ORAL_TABLET | Freq: Once | ORAL | Status: DC | PRN

## 2022-10-12 MED ORDER — 0.9 % SODIUM CHLORIDE (POUR BTL) OPTIME
TOPICAL | Status: DC | PRN
  Administered 2022-10-12: 1000 mL

## 2022-10-12 MED ORDER — BUPIVACAINE LIPOSOME 1.3 % IJ SUSP: 20.0000 mL | Freq: Once | INTRAMUSCULAR | Status: DC

## 2022-10-12 MED ORDER — LIDOCAINE HCL (PF) 2 % IJ SOLN
INTRAMUSCULAR | Status: AC
  Filled 2022-10-12: qty 10

## 2022-10-12 MED ORDER — GLYCOPYRROLATE 0.2 MG/ML IJ SOLN
INTRAMUSCULAR | Status: AC
  Filled 2022-10-12: qty 1

## 2022-10-12 MED ORDER — MIDAZOLAM HCL 2 MG/2ML IJ SOLN: 0.5000 mg | Freq: Once | INTRAMUSCULAR | Status: DC | PRN

## 2022-10-12 MED ORDER — GLYCOPYRROLATE 0.2 MG/ML IJ SOLN
INTRAMUSCULAR | Status: DC | PRN
  Administered 2022-10-12: .2 mg via INTRAVENOUS

## 2022-10-12 MED ORDER — PHENYLEPHRINE HCL-NACL 20-0.9 MG/250ML-% IV SOLN
INTRAVENOUS | Status: DC | PRN
  Administered 2022-10-12: 30 ug/min via INTRAVENOUS

## 2022-10-12 MED ORDER — HYDRALAZINE HCL 20 MG/ML IJ SOLN
INTRAMUSCULAR | Status: AC
  Filled 2022-10-12: qty 1

## 2022-10-12 MED ORDER — CHLORHEXIDINE GLUCONATE CLOTH 2 % EX PADS: 6.0000 | MEDICATED_PAD | Freq: Once | CUTANEOUS | Status: DC

## 2022-10-12 MED ORDER — PROMETHAZINE HCL 25 MG/ML IJ SOLN: 6.2500 mg | INTRAMUSCULAR | Status: DC | PRN

## 2022-10-12 MED ORDER — OXYCODONE HCL 5 MG PO TABS: 5.0000 mg | ORAL_TABLET | Freq: Four times a day (QID) | ORAL | 0 refills | Status: DC | PRN

## 2022-10-12 MED ORDER — SODIUM CHLORIDE (PF) 0.9 % IJ SOLN
INTRAMUSCULAR | Status: AC
  Filled 2022-10-12: qty 10

## 2022-10-12 MED ORDER — FENTANYL CITRATE (PF) 250 MCG/5ML IJ SOLN
INTRAMUSCULAR | Status: AC
  Filled 2022-10-12: qty 5

## 2022-10-12 MED ORDER — PHENYLEPHRINE HCL-NACL 20-0.9 MG/250ML-% IV SOLN
INTRAVENOUS | Status: AC
  Filled 2022-10-12: qty 250

## 2022-10-12 MED ORDER — LACTATED RINGERS IV SOLN: INTRAVENOUS | Status: DC

## 2022-10-12 MED ORDER — CEFAZOLIN SODIUM-DEXTROSE 2-4 GM/100ML-% IV SOLN
2.0000 g | INTRAVENOUS | Status: AC
  Administered 2022-10-12: 2 g via INTRAVENOUS
  Filled 2022-10-12: qty 100

## 2022-10-12 MED ORDER — HYDRALAZINE HCL 20 MG/ML IJ SOLN
INTRAMUSCULAR | Status: DC | PRN
  Administered 2022-10-12: 5 mg via INTRAVENOUS

## 2022-10-12 MED ORDER — OXYCODONE HCL 5 MG/5ML PO SOLN: 5.0000 mg | Freq: Once | ORAL | Status: DC | PRN

## 2022-10-12 MED ORDER — ROCURONIUM BROMIDE 100 MG/10ML IV SOLN
INTRAVENOUS | Status: DC | PRN
  Administered 2022-10-12: 60 mg via INTRAVENOUS
  Administered 2022-10-12: 40 mg via INTRAVENOUS
  Administered 2022-10-12: 20 mg via INTRAVENOUS

## 2022-10-12 SURGICAL SUPPLY — 51 items
ANTIFOG SOL W/FOAM PAD STRL (MISCELLANEOUS) ×1
APPLICATOR COTTON TIP 6 STRL (MISCELLANEOUS) IMPLANT
APPLICATOR COTTON TIP 6IN STRL (MISCELLANEOUS) ×1
BLADE SURG 15 STRL LF DISP TIS (BLADE) ×1 IMPLANT
BLADE SURG 15 STRL SS (BLADE) ×1
CHLORAPREP W/TINT 26 (MISCELLANEOUS) ×1 IMPLANT
COVER MAYO STAND STRL (DRAPES) ×1 IMPLANT
COVER SURGICAL LIGHT HANDLE (MISCELLANEOUS) ×1 IMPLANT
COVER TIP SHEARS 8 DVNC (MISCELLANEOUS) ×1 IMPLANT
COVER TIP SHEARS 8MM DA VINCI (MISCELLANEOUS) ×1
DERMABOND ADVANCED .7 DNX12 (GAUZE/BANDAGES/DRESSINGS) IMPLANT
DRAPE ARM DVNC X/XI (DISPOSABLE) ×3 IMPLANT
DRAPE COLUMN DVNC XI (DISPOSABLE) ×1 IMPLANT
DRAPE DA VINCI XI ARM (DISPOSABLE) ×4
DRAPE DA VINCI XI COLUMN (DISPOSABLE) ×1
ELECT REM PT RETURN 15FT ADLT (MISCELLANEOUS) ×1 IMPLANT
GLOVE SURG LX STRL 8.0 MICRO (GLOVE) ×2 IMPLANT
GOWN STRL REUS W/ TWL XL LVL3 (GOWN DISPOSABLE) ×2 IMPLANT
GOWN STRL REUS W/TWL XL LVL3 (GOWN DISPOSABLE) ×2
GRASPER SUT TROCAR 14GX15 (MISCELLANEOUS) IMPLANT
IRRIG SUCT STRYKERFLOW 2 WTIP (MISCELLANEOUS)
IRRIGATION SUCT STRKRFLW 2 WTP (MISCELLANEOUS) IMPLANT
KIT BASIN OR (CUSTOM PROCEDURE TRAY) ×1 IMPLANT
KIT TURNOVER KIT A (KITS) IMPLANT
MARKER SKIN DUAL TIP RULER LAB (MISCELLANEOUS) ×1 IMPLANT
MESH 3DMAX MID 4X6 LT LRG (Mesh General) IMPLANT
NEEDLE HYPO 22GX1.5 SAFETY (NEEDLE) ×1 IMPLANT
OBTURATOR OPTICAL STANDARD 8MM (TROCAR) ×1
OBTURATOR OPTICAL STND 8 DVNC (TROCAR) ×1
OBTURATOR OPTICALSTD 8 DVNC (TROCAR) ×1 IMPLANT
PACK CARDIOVASCULAR III (CUSTOM PROCEDURE TRAY) ×1 IMPLANT
PAD POSITIONING PINK XL (MISCELLANEOUS) ×1 IMPLANT
SCISSORS LAP 5X35 DISP (ENDOMECHANICALS) IMPLANT
SEAL CANN UNIV 5-8 DVNC XI (MISCELLANEOUS) ×3 IMPLANT
SEAL XI 5MM-8MM UNIVERSAL (MISCELLANEOUS) ×3
SOLUTION ANTFG W/FOAM PAD STRL (MISCELLANEOUS) ×1 IMPLANT
SOLUTION ELECTROLUBE (MISCELLANEOUS) ×1 IMPLANT
SPIKE FLUID TRANSFER (MISCELLANEOUS) ×1 IMPLANT
SUT MNCRL AB 4-0 PS2 18 (SUTURE) ×2 IMPLANT
SUT V-LOC BARB 180 2/0GR6 GS22 (SUTURE)
SUT VIC AB 2-0 SH 27 (SUTURE) ×3
SUT VIC AB 2-0 SH 27X BRD (SUTURE) ×1 IMPLANT
SUT VICRYL 0 TIES 12 18 (SUTURE) IMPLANT
SUT VLOC 180 2-0 9IN GS21 (SUTURE) ×1 IMPLANT
SUTURE V-LC BRB 180 2/0GR6GS22 (SUTURE) IMPLANT
SYR 20ML LL LF (SYRINGE) ×1 IMPLANT
TAPE STRIPS DRAPE STRL (GAUZE/BANDAGES/DRESSINGS) ×1 IMPLANT
TOWEL OR 17X26 10 PK STRL BLUE (TOWEL DISPOSABLE) ×1 IMPLANT
TOWEL OR NON WOVEN STRL DISP B (DISPOSABLE) ×1 IMPLANT
TROCAR XCEL NON-BLD 5MMX100MML (ENDOMECHANICALS) ×1 IMPLANT
TUBING INSUFFLATION 10FT LAP (TUBING) ×1 IMPLANT

## 2022-10-12 NOTE — Anesthesia Postprocedure Evaluation (Signed)
Anesthesia Post Note  Patient: Gerald [name]  Procedure(s) Performed: XI ROBOTIC ASSISTED LEFT INGUINAL HERNIA REPAIR WITH MESH (Left: Inguinal)     Patient location during evaluation: PACU Anesthesia Type: General Level of consciousness: awake and alert, patient cooperative and oriented Pain management: pain level controlled Vital Signs Assessment: post-procedure vital signs reviewed and stable Respiratory status: spontaneous breathing, nonlabored ventilation and respiratory function stable Cardiovascular status: blood pressure returned to baseline and stable Postop Assessment: no apparent nausea or vomiting and able to ambulate Anesthetic complications: no   No notable events documented.  Last Vitals:  Vitals:   10/12/22 1200 10/12/22 1215  BP: 139/82 135/76  Pulse: (!) 56 (!) 53  Resp:    Temp:    SpO2: 92% 92%    Last Pain:  Vitals:   10/12/22 1140  TempSrc:   PainSc: 0-No pain                 Tashari Schoenfelder,E. Jamontae Thwaites

## 2022-10-12 NOTE — Transfer of Care (Signed)
Immediate Anesthesia Transfer of Care Note  Patient: Gerald [name]  Procedure(s) Performed: XI ROBOTIC ASSISTED LEFT INGUINAL HERNIA REPAIR WITH MESH (Left: Inguinal)  Patient Location: PACU  Anesthesia Type:General  Level of Consciousness: awake, alert , oriented, and patient cooperative  Airway & Oxygen Therapy: Patient Spontanous Breathing and Patient connected to face mask oxygen  Post-op Assessment: Report given to RN and Post -op Vital signs reviewed and stable  Post vital signs: Reviewed and stable  Last Vitals:  Vitals Value Taken Time  BP    Temp    Pulse    Resp    SpO2      Last Pain:  Vitals:   10/12/22 0628  TempSrc:   PainSc: 0-No pain      Patients Stated Pain Goal: 3 (10/12/22 2671)  Complications: No notable events documented.

## 2022-10-12 NOTE — Interval H&P Note (Signed)
History and Physical Interval Note:  10/12/2022 7:31 AM  Gerald [name]  has presented today for surgery, with the diagnosis of LEFT INGUINAL HERNIA.  The various methods of treatment have been discussed with the patient and family. After consideration of risks, benefits and other options for treatment, the patient has consented to  Procedure(s): XI ROBOTIC ASSISTED LEFT INGUINAL HERNIA REPAIR WITH MESH (Left) as a surgical intervention.  The patient's history has been reviewed, patient examined, no change in status, stable for surgery.  I have reviewed the patient's chart and labs.  Questions were answered to the patient's satisfaction.     Valarie Merino

## 2022-10-12 NOTE — Anesthesia Procedure Notes (Signed)
Procedure Name: Intubation Date/Time: 10/12/2022 7:51 AM  Performed by: Garrel Ridgel, CRNAPre-anesthesia Checklist: Patient identified, Emergency Drugs available, Suction available and Patient being monitored Patient Re-evaluated:Patient Re-evaluated prior to induction Oxygen Delivery Method: Circle system utilized Preoxygenation: Pre-oxygenation with 100% oxygen Induction Type: IV induction Ventilation: Mask ventilation without difficulty Laryngoscope Size: Mac and 4 Grade View: Grade II Tube type: Oral Tube size: 7.5 mm Number of attempts: 1 Airway Equipment and Method: Stylet and Oral airway Placement Confirmation: ETT inserted through vocal cords under direct vision, positive ETCO2 and breath sounds checked- equal and bilateral Secured at: 24 cm Tube secured with: Tape Dental Injury: Teeth and Oropharynx as per pre-operative assessment

## 2022-10-12 NOTE — Op Note (Signed)
Gerald [name]  1956-10-15   10/12/2022    PCP:  [name], MD   Surgeon: Wenda Low, MD, FACS  Asst:  none  Anes:  general  Preop Dx: Left inguinal hernia Postop Dx: Left indirect sliding inguinal hernia containing sigmoid colon  Procedure: Xi Robotic left inguinal hernia repair Location Surgery: [facility] Complications: None noted  EBL:   minimal cc  Drains: none  Description of Procedure:  The patient was taken to OR 5 .  After anesthesia was administered and the patient was prepped  with chlorohexidine  and a timeout was performed.  Operative start was delayed because there was no 0 degree optical entry trocar.  Exparel block was performed and standard entry performed without issue and with three 8 mm robotic trocars.  A sliding hernia was dissected from the sac without difficulty.  The robot was docked and I sat down at the console at 0845.    Medial flap was was begun about the level of the anterior superior iliac spine I went into the identified the posterior portion of the rectus muscles which were visible and use that as a plane to take this down to the pubis.  I went in laterally on the left side.  I had placed a piece of 3D max mesh large from the left with the intermediate thickness and that was in place as I have carried the dissection and laterally thinning out.  The membrane between the visceral and parietal surfaces was then taken down and the inferior epigastrics were used as a guide down onto the large indirect hernia.  Used mainly the bipolar and the monopolar scissors to tease the sac away from the surrounding tissue.  This was a very large hernia with down into his scrotum and it was retrieved in toto without entering it.  I everted it and then made sure it came off the vas and the spermatic vessels.  When that was complete I went ahead and put the piece of mesh in place and secured it medially down on the pubis and anteriorly again in the medial portion with this  simple sutures of Vicryl.  It looks like it was lying in a good position.  No bleeding was noted.  The flap was closed with 6 inch 2 oh V-Loc from medial to lateral double back in on itself.  There is a little area midway that I thought there was a little gap and so I grabbed the redundant sac and pulled it up and secured that to that area.  I then surveyed the abdomen and everything appeared to be in order.  I did I had performed a block with Exparel at the beginning of the anterior iliac spines bilaterally.  The abdomen was deflated and the incisions were closed with 4-0 Monocryl with Dermabond  The patient tolerated the procedure well and was taken to the PACU in stable condition.     Matt B. Daphine Deutscher, MD, [facility] Surgery, [place] [phone]

## 2022-10-14 ENCOUNTER — Encounter (HOSPITAL_COMMUNITY): Payer: Self-pay | Admitting: Surgery

## 2022-11-08 ENCOUNTER — Telehealth: Payer: Self-pay | Admitting: Nurse Practitioner

## 2022-11-08 ENCOUNTER — Other Ambulatory Visit: Payer: Self-pay

## 2022-11-08 DIAGNOSIS — I10 Essential (primary) hypertension: Secondary | ICD-10-CM

## 2022-11-08 DIAGNOSIS — G250 Essential tremor: Secondary | ICD-10-CM

## 2022-11-08 MED ORDER — PROPRANOLOL HCL ER 120 MG PO CP24: ORAL_CAPSULE | ORAL | 0 refills | Status: DC

## 2022-11-08 NOTE — Telephone Encounter (Signed)
Pt is requesting a refill on Propanolol to go [facility]

## 2022-12-10 ENCOUNTER — Telehealth: Payer: Self-pay | Admitting: Nurse Practitioner

## 2022-12-10 DIAGNOSIS — G250 Essential tremor: Secondary | ICD-10-CM

## 2022-12-10 DIAGNOSIS — I10 Essential (primary) hypertension: Secondary | ICD-10-CM

## 2022-12-10 MED ORDER — PROPRANOLOL HCL ER 120 MG PO CP24: ORAL_CAPSULE | ORAL | 0 refills | Status: DC

## 2022-12-10 NOTE — Telephone Encounter (Signed)
Pt is requesting a refill on Propanolol to go to [facility]

## 2022-12-10 NOTE — Addendum Note (Signed)
Addended by: Chancy Hurter on: 12/10/2022 09:19 AM   Modules accepted: Orders

## 2023-01-09 ENCOUNTER — Ambulatory Visit: Payer: TRICARE For Life (TFL) | Admitting: Nurse Practitioner

## 2023-01-14 ENCOUNTER — Ambulatory Visit (INDEPENDENT_AMBULATORY_CARE_PROVIDER_SITE_OTHER): Payer: Medicare Other | Admitting: Nurse Practitioner

## 2023-01-14 ENCOUNTER — Encounter: Payer: Self-pay | Admitting: Nurse Practitioner

## 2023-01-14 VITALS — BP 170/90 | HR 58 | Temp 97.7°F | Ht 73.0 in | Wt 221.4 lb

## 2023-01-14 DIAGNOSIS — G250 Essential tremor: Secondary | ICD-10-CM

## 2023-01-14 DIAGNOSIS — I10 Essential (primary) hypertension: Secondary | ICD-10-CM

## 2023-01-14 DIAGNOSIS — R7309 Other abnormal glucose: Secondary | ICD-10-CM

## 2023-01-14 DIAGNOSIS — E039 Hypothyroidism, unspecified: Secondary | ICD-10-CM

## 2023-01-14 DIAGNOSIS — Z79899 Other long term (current) drug therapy: Secondary | ICD-10-CM

## 2023-01-14 DIAGNOSIS — E782 Mixed hyperlipidemia: Secondary | ICD-10-CM

## 2023-01-14 DIAGNOSIS — E559 Vitamin D deficiency, unspecified: Secondary | ICD-10-CM

## 2023-01-14 DIAGNOSIS — I447 Left bundle-branch block, unspecified: Secondary | ICD-10-CM

## 2023-01-14 DIAGNOSIS — L570 Actinic keratosis: Secondary | ICD-10-CM

## 2023-01-14 DIAGNOSIS — K4090 Unilateral inguinal hernia, without obstruction or gangrene, not specified as recurrent: Secondary | ICD-10-CM
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
## 2023-01-14 NOTE — Patient Instructions (Signed)
Blood Pressure Record Sheet To take your blood pressure, you will need a blood pressure machine. You may be prescribed one, or you can buy a blood pressure machine (blood pressure monitor) at your clinic, drug store, or online. When choosing one, look for these features: An automatic monitor that has an arm cuff. A cuff that wraps snugly, but not too tightly, around your upper arm. You should be able to fit only one finger between your arm and the cuff. A device that stores blood pressure reading results. Do not choose a monitor that measures your blood pressure from your wrist or finger. Follow your health care provider's instructions for how to take your blood pressure. To use this form: Get one reading in the morning (a.m.) before you take any medicines. Get one reading in the evening (p.m.) before supper. Take at least two readings with each blood pressure check. This makes sure the results are correct. Wait 1-2 minutes between measurements. Write down the results in the spaces on this form. Repeat this once a week, or as told by your health care provider. Make a follow-up appointment with your health care provider to discuss the results. Blood pressure log Date: _______________________ a.m. _____________________(1st reading) _____________________(2nd reading) p.m. _____________________(1st reading) _____________________(2nd reading) Date: _______________________ a.m. _____________________(1st reading) _____________________(2nd reading) p.m. _____________________(1st reading) _____________________(2nd reading) Date: _______________________ a.m. _____________________(1st reading) _____________________(2nd reading) p.m. _____________________(1st reading) _____________________(2nd reading) Date: _______________________ a.m. _____________________(1st reading) _____________________(2nd reading) p.m. _____________________(1st reading) _____________________(2nd reading) Date:  _______________________ a.m. _____________________(1st reading) _____________________(2nd reading) p.m. _____________________(1st reading) _____________________(2nd reading) This information is not intended to replace advice given to you by your health care provider. Make sure you discuss any questions you have with your health care provider. Document Revised: 06/29/2021 Document Reviewed: 06/29/2021 Elsevier Patient Education  2023 Elsevier Inc.   

## 2023-01-15 LAB — COMPLETE METABOLIC PANEL WITH GFR
AG Ratio: 1.8 (calc) (ref 1.0–2.5)
ALT: 21 U/L (ref 9–46)
AST: 18 U/L (ref 10–35)
Albumin: 4.6 g/dL (ref 3.6–5.1)
Alkaline phosphatase (APISO): 103 U/L (ref 35–144)
BUN: 14 mg/dL (ref 7–25)
CO2: 29 mmol/L (ref 20–32)
Calcium: 9.5 mg/dL (ref 8.6–10.3)
Chloride: 100 mmol/L (ref 98–110)
Creat: 0.87 mg/dL (ref 0.70–1.35)
Globulin: 2.5 g/dL (calc) (ref 1.9–3.7)
Glucose, Bld: 86 mg/dL (ref 65–99)
Potassium: 4.4 mmol/L (ref 3.5–5.3)
Sodium: 138 mmol/L (ref 135–146)
Total Bilirubin: 0.8 mg/dL (ref 0.2–1.2)
Total Protein: 7.1 g/dL (ref 6.1–8.1)
eGFR: 96 mL/min/{1.73_m2} (ref >=60)

## 2023-01-15 LAB — CBC WITH DIFFERENTIAL/PLATELET
Absolute Monocytes: 515 cells/uL (ref 200–950)
Basophils Absolute: 39 cells/uL (ref 0–200)
Basophils Relative: 0.7 %
Eosinophils Absolute: 78 cells/uL (ref 15–500)
Eosinophils Relative: 1.4 %
HCT: 45.6 % (ref 38.5–50.0)
Hemoglobin: 16 g/dL (ref 13.2–17.1)
Lymphs Abs: 1781 cells/uL (ref 850–3900)
MCH: 31 pg (ref 27.0–33.0)
MCHC: 35.1 g/dL (ref 32.0–36.0)
MCV: 88.4 fL (ref 80.0–100.0)
MPV: 10.7 fL (ref 7.5–12.5)
Monocytes Relative: 9.2 %
Neutro Abs: 3186 cells/uL (ref 1500–7800)
Neutrophils Relative %: 56.9 %
Platelets: 238 10*3/uL (ref 140–400)
RBC: 5.16 10*6/uL (ref 4.20–5.80)
RDW: 12.9 % (ref 11.0–15.0)
Total Lymphocyte: 31.8 %
WBC: 5.6 10*3/uL (ref 3.8–10.8)

## 2023-01-15 LAB — TSH: TSH: 0.99 mIU/L (ref 0.40–4.50)

## 2023-01-15 LAB — HEMOGLOBIN A1C
Hgb A1c MFr Bld: 5.7 % of total Hgb — ABNORMAL HIGH (ref <5.7)
Mean Plasma Glucose: 117 mg/dL
eAG (mmol/L): 6.5 mmol/L

## 2023-01-15 LAB — LIPID PANEL
Cholesterol: 188 mg/dL (ref <200)
HDL: 58 mg/dL (ref >=40)
LDL Cholesterol (Calc): 107 mg/dL (calc) — ABNORMAL HIGH
Non-HDL Cholesterol (Calc): 130 mg/dL (calc) — ABNORMAL HIGH (ref <130)
Total CHOL/HDL Ratio: 3.2 (calc) (ref <5.0)
Triglycerides: 124 mg/dL (ref <150)

## 2023-02-01 ENCOUNTER — Encounter: Payer: Self-pay | Admitting: Nurse Practitioner

## 2023-02-11 ENCOUNTER — Other Ambulatory Visit: Payer: Self-pay

## 2023-02-11 ENCOUNTER — Telehealth: Payer: Self-pay | Admitting: Nurse Practitioner

## 2023-02-11 DIAGNOSIS — G250 Essential tremor: Secondary | ICD-10-CM

## 2023-02-11 DIAGNOSIS — I10 Essential (primary) hypertension: Secondary | ICD-10-CM

## 2023-02-11 MED ORDER — PROPRANOLOL HCL ER 120 MG PO CP24: ORAL_CAPSULE | ORAL | 0 refills | Status: DC

## 2023-02-11 NOTE — Telephone Encounter (Signed)
Pt says that Domenic Schwab has been faxing [place] since last Wednesday to refill Propanolol, he missed today's dose and is needing it today is possible

## 2023-02-11 NOTE — Telephone Encounter (Signed)
MADE IN ERROR

## 2023-02-18 ENCOUNTER — Other Ambulatory Visit: Payer: Self-pay | Admitting: Nurse Practitioner

## 2023-02-18 MED ORDER — HYDROCHLOROTHIAZIDE 25 MG PO TABS: ORAL_TABLET | ORAL | 1 refills | Status: DC

## 2023-02-25 ENCOUNTER — Encounter: Payer: TRICARE For Life (TFL) | Admitting: Internal Medicine

## 2023-03-12 ENCOUNTER — Other Ambulatory Visit: Payer: Self-pay

## 2023-03-12 DIAGNOSIS — I10 Essential (primary) hypertension: Secondary | ICD-10-CM

## 2023-03-12 DIAGNOSIS — G250 Essential tremor: Secondary | ICD-10-CM

## 2023-03-12 MED ORDER — PROPRANOLOL HCL ER 120 MG PO CP24: ORAL_CAPSULE | ORAL | 0 refills | Status: DC

## 2023-04-02 ENCOUNTER — Encounter: Payer: Self-pay | Admitting: Nurse Practitioner

## 2023-04-15 ENCOUNTER — Other Ambulatory Visit: Payer: Self-pay | Admitting: Nurse Practitioner

## 2023-04-15 DIAGNOSIS — I10 Essential (primary) hypertension: Secondary | ICD-10-CM

## 2023-05-08 ENCOUNTER — Encounter: Payer: Self-pay | Admitting: Internal Medicine

## 2023-05-08 NOTE — Patient Instructions (Signed)
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
## 2023-05-09 ENCOUNTER — Ambulatory Visit (INDEPENDENT_AMBULATORY_CARE_PROVIDER_SITE_OTHER): Payer: Medicare Other | Admitting: Internal Medicine

## 2023-05-09 ENCOUNTER — Encounter: Payer: Self-pay | Admitting: Internal Medicine

## 2023-05-09 VITALS — BP 138/82 | HR 62 | Temp 97.9°F | Resp 17 | Ht 73.0 in | Wt 208.2 lb

## 2023-05-09 DIAGNOSIS — E559 Vitamin D deficiency, unspecified: Secondary | ICD-10-CM

## 2023-05-09 DIAGNOSIS — Z0001 Encounter for general adult medical examination with abnormal findings: Secondary | ICD-10-CM

## 2023-05-09 DIAGNOSIS — Z8249 Family history of ischemic heart disease and other diseases of the circulatory system: Secondary | ICD-10-CM

## 2023-05-09 DIAGNOSIS — I700 Atherosclerosis of aorta: Secondary | ICD-10-CM | POA: Diagnosis not present

## 2023-05-09 DIAGNOSIS — E782 Mixed hyperlipidemia: Secondary | ICD-10-CM

## 2023-05-09 DIAGNOSIS — R7309 Other abnormal glucose: Secondary | ICD-10-CM

## 2023-05-09 DIAGNOSIS — I10 Essential (primary) hypertension: Secondary | ICD-10-CM | POA: Diagnosis not present

## 2023-05-09 DIAGNOSIS — I447 Left bundle-branch block, unspecified: Secondary | ICD-10-CM

## 2023-05-09 DIAGNOSIS — E039 Hypothyroidism, unspecified: Secondary | ICD-10-CM

## 2023-05-09 DIAGNOSIS — Z79899 Other long term (current) drug therapy: Secondary | ICD-10-CM

## 2023-05-09 DIAGNOSIS — R5383 Other fatigue: Secondary | ICD-10-CM

## 2023-05-09 DIAGNOSIS — N138 Other obstructive and reflux uropathy: Secondary | ICD-10-CM

## 2023-05-09 DIAGNOSIS — Z1211 Encounter for screening for malignant neoplasm of colon: Secondary | ICD-10-CM

## 2023-05-09 DIAGNOSIS — Z136 Encounter for screening for cardiovascular disorders: Secondary | ICD-10-CM

## 2023-05-09 DIAGNOSIS — Z125 Encounter for screening for malignant neoplasm of prostate: Secondary | ICD-10-CM

## 2023-05-09 DIAGNOSIS — G250 Essential tremor: Secondary | ICD-10-CM

## 2023-05-10 LAB — CBC WITH DIFFERENTIAL/PLATELET
Absolute Monocytes: 610 cells/uL (ref 200–950)
Basophils Absolute: 50 cells/uL (ref 0–200)
Basophils Relative: 0.9 %
Eosinophils Absolute: 90 cells/uL (ref 15–500)
Eosinophils Relative: 1.6 %
HCT: 44.6 % (ref 38.5–50.0)
Hemoglobin: 15.2 g/dL (ref 13.2–17.1)
Lymphs Abs: 1859 cells/uL (ref 850–3900)
MCH: 29.9 pg (ref 27.0–33.0)
MCHC: 34.1 g/dL (ref 32.0–36.0)
MCV: 87.6 fL (ref 80.0–100.0)
MPV: 10.9 fL (ref 7.5–12.5)
Monocytes Relative: 10.9 %
Neutro Abs: 2990 cells/uL (ref 1500–7800)
Neutrophils Relative %: 53.4 %
Platelets: 258 10*3/uL (ref 140–400)
RBC: 5.09 10*6/uL (ref 4.20–5.80)
RDW: 11.7 % (ref 11.0–15.0)
Total Lymphocyte: 33.2 %
WBC: 5.6 10*3/uL (ref 3.8–10.8)

## 2023-05-10 LAB — URINALYSIS, ROUTINE W REFLEX MICROSCOPIC
Bilirubin Urine: NEGATIVE
Glucose, UA: NEGATIVE
Hgb urine dipstick: NEGATIVE
Ketones, ur: NEGATIVE
Leukocytes,Ua: NEGATIVE
Nitrite: NEGATIVE
Protein, ur: NEGATIVE
Specific Gravity, Urine: 1.008 (ref 1.001–1.035)
pH: 6.5 (ref 5.0–8.0)

## 2023-05-10 LAB — LIPID PANEL
Cholesterol: 129 mg/dL (ref <200)
HDL: 47 mg/dL (ref >=40)
LDL Cholesterol (Calc): 58 mg/dL (calc)
Non-HDL Cholesterol (Calc): 82 mg/dL (calc) (ref <130)
Total CHOL/HDL Ratio: 2.7 (calc) (ref <5.0)
Triglycerides: 166 mg/dL — ABNORMAL HIGH (ref <150)

## 2023-05-10 LAB — TSH: TSH: 1.05 mIU/L (ref 0.40–4.50)

## 2023-05-10 LAB — VITAMIN D 25 HYDROXY (VIT D DEFICIENCY, FRACTURES): Vit D, 25-Hydroxy: 64 ng/mL (ref 30–100)

## 2023-05-10 LAB — COMPLETE METABOLIC PANEL WITH GFR
AG Ratio: 2.1 (calc) (ref 1.0–2.5)
ALT: 18 U/L (ref 9–46)
AST: 14 U/L (ref 10–35)
Albumin: 4.8 g/dL (ref 3.6–5.1)
Alkaline phosphatase (APISO): 82 U/L (ref 35–144)
BUN: 12 mg/dL (ref 7–25)
CO2: 29 mmol/L (ref 20–32)
Calcium: 9.8 mg/dL (ref 8.6–10.3)
Chloride: 101 mmol/L (ref 98–110)
Creat: 1.05 mg/dL (ref 0.70–1.35)
Globulin: 2.3 g/dL (calc) (ref 1.9–3.7)
Glucose, Bld: 84 mg/dL (ref 65–99)
Potassium: 4.1 mmol/L (ref 3.5–5.3)
Sodium: 138 mmol/L (ref 135–146)
Total Bilirubin: 0.6 mg/dL (ref 0.2–1.2)
Total Protein: 7.1 g/dL (ref 6.1–8.1)
eGFR: 79 mL/min/{1.73_m2} (ref >=60)

## 2023-05-10 LAB — MAGNESIUM: Magnesium: 2.3 mg/dL (ref 1.5–2.5)

## 2023-05-10 LAB — MICROALBUMIN / CREATININE URINE RATIO
Creatinine, Urine: 43 mg/dL (ref 20–320)
Microalb Creat Ratio: 70 mg/g creat — ABNORMAL HIGH (ref <30)
Microalb, Ur: 3 mg/dL

## 2023-05-10 LAB — HEMOGLOBIN A1C W/OUT EAG: Hgb A1c MFr Bld: 5.7 % of total Hgb — ABNORMAL HIGH (ref <5.7)

## 2023-05-10 LAB — PSA: PSA: 1.6 ng/mL (ref <=4.00)

## 2023-05-10 LAB — INSULIN, RANDOM: Insulin: 20.1 u[IU]/mL — ABNORMAL HIGH

## 2023-05-11 ENCOUNTER — Encounter: Payer: Self-pay | Admitting: Internal Medicine

## 2023-05-11 NOTE — Progress Notes (Signed)
^<^<^<^<^<^<^<^<^<^<^<^<^<^<^<^<^<^<^<^<^<^<^<^<^<^<^<^<^<^<^<^<^<^<^<^<^ ^>^>^>^>^>^>^>^>^>^>^>>^>^>^>^>^>^>^>^>^>^>^>^>^>^>^>^>^>^>^>^>^>^>^>^>^>  -  Test results slightly outside the reference range are not unusual. If there is anything important, I will review this with you,  otherwise it is considered normal test values.  If you have further questions,  please do not hesitate to contact me at the office or via My Chart.   ^<^<^<^<^<^<^<^<^<^<^<^<^<^<^<^<^<^<^<^<^<^<^<^<^<^<^<^<^<^<^<^<^<^<^<^<^ ^>^>^>^>^>^>^>^>^>^>^>^>^>^>^>^>^>^>^>^>^>^>^>^>^>^>^>^>^>^>^>^>^>^>^>^>^  - Chol = 129 - Excellent   ^>^>^>^>^>^>^>^>^>^>^>^>^>^>^>^>^>^>^>^>^>^>^>^>^>^>^>^>^>^>^>^>^>^>^>^>^  -  A1c = 5.7% - Still Borderline elevated 12 week average Blood Sugar ,  So . . . Marland Kitchen  - Avoid Sweets, Candy & White Stuff   - White Rice, White [place], White Flour  - Breads &  Pasta  ^>^>^>^>^>^>^>^>^>^>^>^>^>^>^>^>^>^>^>^>^>^>^>^>^>^>^>^>^>^>^>^>^>^>^>^>^ ^>^>^>^>^>^>^>^>^>^>^>^>^>^>^>^>^>^>^>^>^>^>^>^>^>^>^>^>^>^>^>^>^>^>^>^>^  -  PSA - OK - No Prostate Cancer -   Great  !  ^>^>^>^>^>^>^>^>^>^>^>^>^>^>^>^>^>^>^>^>^>^>^>^>^>^>^>^>^>^>^>^>^>^>^>^>^  -  Vitamin D = 64  - Excellent - Please keep Dosage same   ^>^>^>^>^>^>^>^>^>^>^>^>^>^>^>^>^>^>^>^>^>^>^>^>^>^>^>^>^>^>^>^>^>^>^>^>^  -  All Else - CBC - Kidneys - Electrolytes - Liver - Magnesium & Thyroid    - all  Normal / OK  ^>^>^>^>^>^>^>^>^>^>^>^>^>^>^>^>^>^>^>^>^>^>^>^>^>^>^>^>^>^>^>^>^>^>^>^>^ ^>^>^>^>^>^>^>^>^>^>^>^>^>^>^>^>^>^>^>^>^>^>^>^>^>^>^>^>^>^>^>^>^>^>^>^>^

## 2023-06-01 ENCOUNTER — Other Ambulatory Visit: Payer: Self-pay | Admitting: Nurse Practitioner

## 2023-06-13 ENCOUNTER — Other Ambulatory Visit: Payer: Self-pay | Admitting: Nurse Practitioner

## 2023-06-13 DIAGNOSIS — I10 Essential (primary) hypertension: Secondary | ICD-10-CM

## 2023-06-13 DIAGNOSIS — G250 Essential tremor: Secondary | ICD-10-CM

## 2023-06-13 MED ORDER — OLMESARTAN MEDOXOMIL 40 MG PO TABS
ORAL_TABLET | ORAL | 0 refills | Status: DC
Start: 2023-06-13 — End: 2023-07-13

## 2023-06-13 MED ORDER — PROPRANOLOL HCL ER 120 MG PO CP24
ORAL_CAPSULE | ORAL | 0 refills | Status: DC
Start: 2023-06-13 — End: 2023-07-13

## 2023-07-13 ENCOUNTER — Other Ambulatory Visit: Payer: Self-pay | Admitting: Nurse Practitioner

## 2023-07-13 DIAGNOSIS — I10 Essential (primary) hypertension: Secondary | ICD-10-CM

## 2023-07-13 DIAGNOSIS — G250 Essential tremor: Secondary | ICD-10-CM

## 2023-07-14 MED ORDER — PROPRANOLOL HCL ER 120 MG PO CP24
ORAL_CAPSULE | ORAL | 3 refills | Status: DC
Start: 2023-07-14 — End: 2024-07-13

## 2023-07-14 MED ORDER — OLMESARTAN MEDOXOMIL 40 MG PO TABS
ORAL_TABLET | ORAL | 3 refills | Status: DC
Start: 2023-07-14 — End: 2024-08-17

## 2023-08-12 ENCOUNTER — Encounter: Payer: Self-pay | Admitting: Nurse Practitioner

## 2023-08-12 ENCOUNTER — Ambulatory Visit (INDEPENDENT_AMBULATORY_CARE_PROVIDER_SITE_OTHER): Payer: Medicare Other | Admitting: Nurse Practitioner

## 2023-08-12 VITALS — BP 136/78 | HR 63 | Temp 98.0°F | Resp 16 | Ht 73.0 in | Wt 210.0 lb

## 2023-08-12 DIAGNOSIS — E782 Mixed hyperlipidemia: Secondary | ICD-10-CM | POA: Diagnosis not present

## 2023-08-12 DIAGNOSIS — R7309 Other abnormal glucose: Secondary | ICD-10-CM

## 2023-08-12 DIAGNOSIS — K4090 Unilateral inguinal hernia, without obstruction or gangrene, not specified as recurrent: Secondary | ICD-10-CM

## 2023-08-12 DIAGNOSIS — Z23 Encounter for immunization: Secondary | ICD-10-CM | POA: Diagnosis not present

## 2023-08-12 DIAGNOSIS — I10 Essential (primary) hypertension: Secondary | ICD-10-CM | POA: Diagnosis not present

## 2023-08-12 DIAGNOSIS — I447 Left bundle-branch block, unspecified: Secondary | ICD-10-CM | POA: Diagnosis not present

## 2023-08-12 DIAGNOSIS — E559 Vitamin D deficiency, unspecified: Secondary | ICD-10-CM

## 2023-08-12 DIAGNOSIS — Z79899 Other long term (current) drug therapy: Secondary | ICD-10-CM

## 2023-08-12 DIAGNOSIS — E039 Hypothyroidism, unspecified: Secondary | ICD-10-CM

## 2023-08-12 DIAGNOSIS — L570 Actinic keratosis: Secondary | ICD-10-CM

## 2023-08-12 DIAGNOSIS — R251 Tremor, unspecified: Secondary | ICD-10-CM

## 2023-08-12 NOTE — Patient Instructions (Signed)
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
## 2023-08-13 LAB — COMPLETE METABOLIC PANEL WITH GFR
AG Ratio: 2 (calc) (ref 1.0–2.5)
ALT: 17 U/L (ref 9–46)
AST: 17 U/L (ref 10–35)
Albumin: 4.9 g/dL (ref 3.6–5.1)
Alkaline phosphatase (APISO): 94 U/L (ref 35–144)
BUN: 16 mg/dL (ref 7–25)
CO2: 27 mmol/L (ref 20–32)
Calcium: 9.7 mg/dL (ref 8.6–10.3)
Chloride: 101 mmol/L (ref 98–110)
Creat: 1.04 mg/dL (ref 0.70–1.35)
Globulin: 2.5 g/dL (ref 1.9–3.7)
Glucose, Bld: 112 mg/dL — ABNORMAL HIGH (ref 65–99)
Potassium: 4 mmol/L (ref 3.5–5.3)
Sodium: 138 mmol/L (ref 135–146)
Total Bilirubin: 0.6 mg/dL (ref 0.2–1.2)
Total Protein: 7.4 g/dL (ref 6.1–8.1)
eGFR: 80 mL/min/{1.73_m2} (ref >=60)

## 2023-08-13 LAB — CBC WITH DIFFERENTIAL/PLATELET
Absolute Monocytes: 466 {cells}/uL (ref 200–950)
Basophils Absolute: 39 {cells}/uL (ref 0–200)
Basophils Relative: 0.8 %
Eosinophils Absolute: 88 {cells}/uL (ref 15–500)
Eosinophils Relative: 1.8 %
HCT: 45.7 % (ref 38.5–50.0)
Hemoglobin: 15.4 g/dL (ref 13.2–17.1)
Lymphs Abs: 1720 {cells}/uL (ref 850–3900)
MCH: 29.7 pg (ref 27.0–33.0)
MCHC: 33.7 g/dL (ref 32.0–36.0)
MCV: 88.1 fL (ref 80.0–100.0)
MPV: 10.4 fL (ref 7.5–12.5)
Monocytes Relative: 9.5 %
Neutro Abs: 2587 {cells}/uL (ref 1500–7800)
Neutrophils Relative %: 52.8 %
Platelets: 249 10*3/uL (ref 140–400)
RBC: 5.19 10*6/uL (ref 4.20–5.80)
RDW: 13 % (ref 11.0–15.0)
Total Lymphocyte: 35.1 %
WBC: 4.9 10*3/uL (ref 3.8–10.8)

## 2023-08-13 LAB — LIPID PANEL
Cholesterol: 198 mg/dL (ref <200)
HDL: 57 mg/dL (ref >=40)
LDL Cholesterol (Calc): 117 mg/dL — ABNORMAL HIGH
Non-HDL Cholesterol (Calc): 141 mg/dL — ABNORMAL HIGH (ref <130)
Total CHOL/HDL Ratio: 3.5 (calc) (ref <5.0)
Triglycerides: 127 mg/dL (ref <150)

## 2023-08-13 LAB — TSH: TSH: 0.91 m[IU]/L (ref 0.40–4.50)

## 2023-08-13 LAB — HEMOGLOBIN A1C
Hgb A1c MFr Bld: 5.6 %{Hb} (ref <5.7)
Mean Plasma Glucose: 114 mg/dL
eAG (mmol/L): 6.3 mmol/L

## 2023-09-05 ENCOUNTER — Other Ambulatory Visit: Payer: Self-pay | Admitting: Nurse Practitioner

## 2023-09-06 ENCOUNTER — Other Ambulatory Visit: Payer: Self-pay

## 2023-09-06 MED ORDER — LEVOTHYROXINE SODIUM 50 MCG PO TABS: ORAL_TABLET | ORAL | 3 refills | Status: DC

## 2023-11-09 ENCOUNTER — Other Ambulatory Visit: Payer: Self-pay | Admitting: Nurse Practitioner

## 2023-11-11 ENCOUNTER — Encounter: Payer: Self-pay | Admitting: Internal Medicine

## 2023-11-11 NOTE — Patient Instructions (Signed)
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
## 2023-11-12 ENCOUNTER — Ambulatory Visit (INDEPENDENT_AMBULATORY_CARE_PROVIDER_SITE_OTHER): Payer: Medicare Other | Admitting: Internal Medicine

## 2023-11-12 ENCOUNTER — Encounter: Payer: Self-pay | Admitting: Internal Medicine

## 2023-11-12 VITALS — BP 118/70 | HR 17 | Temp 97.9°F | Resp 17 | Ht 73.0 in | Wt 213.4 lb

## 2023-11-12 DIAGNOSIS — E039 Hypothyroidism, unspecified: Secondary | ICD-10-CM

## 2023-11-12 DIAGNOSIS — I10 Essential (primary) hypertension: Secondary | ICD-10-CM | POA: Diagnosis not present

## 2023-11-12 DIAGNOSIS — E782 Mixed hyperlipidemia: Secondary | ICD-10-CM

## 2023-11-12 DIAGNOSIS — E559 Vitamin D deficiency, unspecified: Secondary | ICD-10-CM

## 2023-11-12 DIAGNOSIS — Z79899 Other long term (current) drug therapy: Secondary | ICD-10-CM

## 2023-11-12 DIAGNOSIS — R7309 Other abnormal glucose: Secondary | ICD-10-CM | POA: Diagnosis not present

## 2023-11-12 DIAGNOSIS — G250 Essential tremor: Secondary | ICD-10-CM

## 2023-11-13 LAB — COMPLETE METABOLIC PANEL WITH GFR
AG Ratio: 1.9 (calc) (ref 1.0–2.5)
ALT: 21 U/L (ref 9–46)
AST: 15 U/L (ref 10–35)
Albumin: 4.7 g/dL (ref 3.6–5.1)
Alkaline phosphatase (APISO): 104 U/L (ref 35–144)
BUN: 13 mg/dL (ref 7–25)
CO2: 33 mmol/L — ABNORMAL HIGH (ref 20–32)
Calcium: 9.5 mg/dL (ref 8.6–10.3)
Chloride: 99 mmol/L (ref 98–110)
Creat: 0.99 mg/dL (ref 0.70–1.35)
Globulin: 2.5 g/dL (ref 1.9–3.7)
Glucose, Bld: 89 mg/dL (ref 65–99)
Potassium: 4.3 mmol/L (ref 3.5–5.3)
Sodium: 138 mmol/L (ref 135–146)
Total Bilirubin: 0.6 mg/dL (ref 0.2–1.2)
Total Protein: 7.2 g/dL (ref 6.1–8.1)
eGFR: 85 mL/min/{1.73_m2} (ref >=60)

## 2023-11-13 LAB — CBC WITH DIFFERENTIAL/PLATELET
Absolute Lymphocytes: 1972 {cells}/uL (ref 850–3900)
Absolute Monocytes: 559 {cells}/uL (ref 200–950)
Basophils Absolute: 51 {cells}/uL (ref 0–200)
Basophils Relative: 0.9 %
Eosinophils Absolute: 108 {cells}/uL (ref 15–500)
Eosinophils Relative: 1.9 %
HCT: 41 % (ref 38.5–50.0)
Hemoglobin: 13.8 g/dL (ref 13.2–17.1)
MCH: 29.7 pg (ref 27.0–33.0)
MCHC: 33.7 g/dL (ref 32.0–36.0)
MCV: 88.2 fL (ref 80.0–100.0)
MPV: 10.6 fL (ref 7.5–12.5)
Monocytes Relative: 9.8 %
Neutro Abs: 3010 {cells}/uL (ref 1500–7800)
Neutrophils Relative %: 52.8 %
Platelets: 272 10*3/uL (ref 140–400)
RBC: 4.65 10*6/uL (ref 4.20–5.80)
RDW: 13.7 % (ref 11.0–15.0)
Total Lymphocyte: 34.6 %
WBC: 5.7 10*3/uL (ref 3.8–10.8)

## 2023-11-13 LAB — LIPID PANEL
Cholesterol: 217 mg/dL — ABNORMAL HIGH (ref <200)
HDL: 51 mg/dL (ref >=40)
LDL Cholesterol (Calc): 130 mg/dL — ABNORMAL HIGH
Non-HDL Cholesterol (Calc): 166 mg/dL — ABNORMAL HIGH (ref <130)
Total CHOL/HDL Ratio: 4.3 (calc) (ref <5.0)
Triglycerides: 215 mg/dL — ABNORMAL HIGH (ref <150)

## 2023-11-13 LAB — TSH: TSH: 1.29 m[IU]/L (ref 0.40–4.50)

## 2023-11-13 LAB — INSULIN, RANDOM: Insulin: 18.7 u[IU]/mL — ABNORMAL HIGH

## 2023-11-13 LAB — HEMOGLOBIN A1C
Hgb A1c MFr Bld: 5.8 %{Hb} — ABNORMAL HIGH (ref <5.7)
Mean Plasma Glucose: 120 mg/dL
eAG (mmol/L): 6.6 mmol/L

## 2023-11-13 LAB — VITAMIN D 25 HYDROXY (VIT D DEFICIENCY, FRACTURES): Vit D, 25-Hydroxy: 64 ng/mL (ref 30–100)

## 2023-11-13 LAB — MAGNESIUM: Magnesium: 2.4 mg/dL (ref 1.5–2.5)

## 2024-02-11 ENCOUNTER — Ambulatory Visit: Admitting: Family Medicine

## 2024-02-11 ENCOUNTER — Encounter: Payer: Self-pay | Admitting: Family Medicine

## 2024-02-11 VITALS — BP 122/78 | HR 61 | Resp 18 | Ht 73.0 in | Wt 217.0 lb

## 2024-02-11 DIAGNOSIS — Z23 Encounter for immunization: Secondary | ICD-10-CM | POA: Diagnosis not present

## 2024-02-11 DIAGNOSIS — I10 Essential (primary) hypertension: Secondary | ICD-10-CM

## 2024-02-11 DIAGNOSIS — E782 Mixed hyperlipidemia: Secondary | ICD-10-CM | POA: Diagnosis not present

## 2024-02-11 DIAGNOSIS — Z125 Encounter for screening for malignant neoplasm of prostate: Secondary | ICD-10-CM

## 2024-02-11 DIAGNOSIS — E039 Hypothyroidism, unspecified: Secondary | ICD-10-CM

## 2024-02-11 DIAGNOSIS — Z7689 Persons encountering health services in other specified circumstances: Secondary | ICD-10-CM | POA: Insufficient documentation

## 2024-02-11 DIAGNOSIS — E559 Vitamin D deficiency, unspecified: Secondary | ICD-10-CM

## 2024-02-11 NOTE — Assessment & Plan Note (Signed)
-  Continue Vitamin D daily

## 2024-02-11 NOTE — Assessment & Plan Note (Signed)
 Continue Rosuvastatin 20mg  daily.  I recommend consuming a heart healthy diet such as Mediterranean diet or DASH diet with whole grains, fruits, vegetable, fish, lean meats, nuts, and olive oil. Limit sweets and processed foods. I also encourage moderate intensity exercise 150 minutes weekly. This is 3-5 times weekly for 30-50 minutes each session. Goal should be pace of 3 miles/hours, or walking 1.5 miles in 30 minutes. The 10-year ASCVD risk score (Arnett DK, et al., 2019) is: 15.3%

## 2024-02-11 NOTE — Patient Instructions (Signed)
 It was great to meet you today and I'm excited to have you join the [facility] practice. I hope you had a positive experience today! If you feel so inclined, please feel free to recommend our practice to friends and family. Kurtis Bushman, FNP-C

## 2024-02-11 NOTE — Assessment & Plan Note (Addendum)
 Chronic. Well controlled on Synthroid 50mcg daily. Euthyroid on exam Follow up in 3 months for CPE or sooner if needed.

## 2024-02-11 NOTE — Assessment & Plan Note (Signed)
 Chronic well controlled. Continue olmesartan 40mg  daily and hydrochlorothiazide 25mg  daily. Recommend heart healthy diet such as Mediterranean diet with whole grains, fruits, vegetable, fish, lean meats, nuts, and olive oil. Limit salt. Encouraged moderate walking, 3-5 times/week for 30-50 minutes each session. Aim for at least 150 minutes.week. Goal should be pace of 3 miles/hours, or walking 1.5 miles in 30 minutes. Avoid tobacco products. Avoid excess alcohol. Take medications as prescribed and bring medications and blood pressure log with cuff to each office visit. Seek medical care for chest pain, palpitations, shortness of breath with exertion, dizziness/lightheadedness, vision changes, recurrent headaches, or swelling of extremities. Follow up in 3 months for CPE

## 2024-02-11 NOTE — Assessment & Plan Note (Signed)
 Today we reviewed your medical history, health maintenance items, and current concerns. PNA and shingles vaccines were administered. Follow up in 3 months for CPE or sooner if needed.
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
## 2024-02-12 ENCOUNTER — Ambulatory Visit: Payer: TRICARE For Life (TFL) | Admitting: Nurse Practitioner

## 2024-05-08 ENCOUNTER — Encounter: Payer: TRICARE For Life (TFL) | Admitting: Internal Medicine

## 2024-05-22 ENCOUNTER — Other Ambulatory Visit

## 2024-05-22 DIAGNOSIS — I10 Essential (primary) hypertension: Secondary | ICD-10-CM

## 2024-05-22 DIAGNOSIS — E039 Hypothyroidism, unspecified: Secondary | ICD-10-CM

## 2024-05-22 DIAGNOSIS — E782 Mixed hyperlipidemia: Secondary | ICD-10-CM

## 2024-05-22 DIAGNOSIS — E559 Vitamin D deficiency, unspecified: Secondary | ICD-10-CM

## 2024-05-22 DIAGNOSIS — Z125 Encounter for screening for malignant neoplasm of prostate: Secondary | ICD-10-CM

## 2024-05-23 LAB — COMPREHENSIVE METABOLIC PANEL WITH GFR
AG Ratio: 2 (calc) (ref 1.0–2.5)
ALT: 15 U/L (ref 9–46)
AST: 12 U/L (ref 10–35)
Albumin: 4.6 g/dL (ref 3.6–5.1)
Alkaline phosphatase (APISO): 75 U/L (ref 35–144)
BUN: 24 mg/dL (ref 7–25)
CO2: 29 mmol/L (ref 20–32)
Calcium: 9 mg/dL (ref 8.6–10.3)
Chloride: 101 mmol/L (ref 98–110)
Creat: 1.17 mg/dL (ref 0.70–1.35)
Globulin: 2.3 g/dL (ref 1.9–3.7)
Glucose, Bld: 96 mg/dL (ref 65–99)
Potassium: 4.3 mmol/L (ref 3.5–5.3)
Sodium: 138 mmol/L (ref 135–146)
Total Bilirubin: 0.6 mg/dL (ref 0.2–1.2)
Total Protein: 6.9 g/dL (ref 6.1–8.1)
eGFR: 69 mL/min/1.73m2 (ref >=60)

## 2024-05-23 LAB — CBC WITH DIFFERENTIAL/PLATELET
Absolute Lymphocytes: 2221 {cells}/uL (ref 850–3900)
Absolute Monocytes: 509 {cells}/uL (ref 200–950)
Basophils Absolute: 48 {cells}/uL (ref 0–200)
Basophils Relative: 0.9 %
Eosinophils Absolute: 111 {cells}/uL (ref 15–500)
Eosinophils Relative: 2.1 %
HCT: 42.3 % (ref 38.5–50.0)
Hemoglobin: 14 g/dL (ref 13.2–17.1)
MCH: 30.4 pg (ref 27.0–33.0)
MCHC: 33.1 g/dL (ref 32.0–36.0)
MCV: 91.8 fL (ref 80.0–100.0)
MPV: 10.3 fL (ref 7.5–12.5)
Monocytes Relative: 9.6 %
Neutro Abs: 2412 {cells}/uL (ref 1500–7800)
Neutrophils Relative %: 45.5 %
Platelets: 240 Thousand/uL (ref 140–400)
RBC: 4.61 Million/uL (ref 4.20–5.80)
RDW: 13.5 % (ref 11.0–15.0)
Total Lymphocyte: 41.9 %
WBC: 5.3 Thousand/uL (ref 3.8–10.8)

## 2024-05-23 LAB — PSA: PSA: 2.48 ng/mL (ref <=4.00)

## 2024-05-23 LAB — HEMOGLOBIN A1C
Hgb A1c MFr Bld: 5.6 % (ref <5.7)
Mean Plasma Glucose: 114 mg/dL
eAG (mmol/L): 6.3 mmol/L

## 2024-05-23 LAB — LIPID PANEL
Cholesterol: 198 mg/dL (ref <200)
HDL: 58 mg/dL (ref >=40)
LDL Cholesterol (Calc): 119 mg/dL — ABNORMAL HIGH
Non-HDL Cholesterol (Calc): 140 mg/dL — ABNORMAL HIGH (ref <130)
Total CHOL/HDL Ratio: 3.4 (calc) (ref <5.0)
Triglycerides: 104 mg/dL (ref <150)

## 2024-05-23 LAB — VITAMIN D 25 HYDROXY (VIT D DEFICIENCY, FRACTURES): Vit D, 25-Hydroxy: 72 ng/mL (ref 30–100)

## 2024-05-23 LAB — TSH: TSH: 1.3 m[IU]/L (ref 0.40–4.50)

## 2024-05-25 ENCOUNTER — Ambulatory Visit: Payer: Self-pay | Admitting: Family Medicine

## 2024-05-27 ENCOUNTER — Encounter: Payer: Self-pay | Admitting: Family Medicine

## 2024-05-27 ENCOUNTER — Ambulatory Visit: Admitting: Family Medicine

## 2024-05-27 VITALS — BP 134/80 | HR 55 | Temp 98.1°F | Ht 73.0 in | Wt 211.0 lb

## 2024-05-27 DIAGNOSIS — Z0000 Encounter for general adult medical examination without abnormal findings: Secondary | ICD-10-CM | POA: Insufficient documentation

## 2024-05-27 DIAGNOSIS — I10 Essential (primary) hypertension: Secondary | ICD-10-CM

## 2024-05-27 DIAGNOSIS — E782 Mixed hyperlipidemia: Secondary | ICD-10-CM

## 2024-05-27 DIAGNOSIS — R251 Tremor, unspecified: Secondary | ICD-10-CM

## 2024-05-27 DIAGNOSIS — Z0001 Encounter for general adult medical examination with abnormal findings: Secondary | ICD-10-CM

## 2024-05-27 DIAGNOSIS — E039 Hypothyroidism, unspecified: Secondary | ICD-10-CM

## 2024-05-27 DIAGNOSIS — E559 Vitamin D deficiency, unspecified: Secondary | ICD-10-CM

## 2024-05-27 DIAGNOSIS — I447 Left bundle-branch block, unspecified: Secondary | ICD-10-CM

## 2024-05-27 DIAGNOSIS — Z23 Encounter for immunization: Secondary | ICD-10-CM

## 2024-05-27 NOTE — Assessment & Plan Note (Signed)
 Continue Rosuvastatin  20mg  daily. Counseled on medication compliance. Cholesterol high with LDL 115. Will start taking medication daily and recheck in 6-8 weeks. .  I recommend consuming a heart healthy diet such as Mediterranean diet or DASH diet with whole grains, fruits, vegetable, fish, lean meats, nuts, and olive oil. Limit sweets and processed foods. I also encourage moderate intensity exercise 150 minutes weekly. This is 3-5 times weekly for 30-50 minutes each session. Goal should be pace of 3 miles/hours, or walking 1.5 miles in 30 minutes. The 10-year ASCVD risk score (Arnett DK, et al., 2019) is: 15.7%
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
## 2024-05-27 NOTE — Assessment & Plan Note (Signed)

## 2024-05-27 NOTE — Assessment & Plan Note (Signed)
 Benign essential at baseline

## 2024-05-27 NOTE — Assessment & Plan Note (Signed)
 Chronic. Well controlled on Synthroid  50mcg daily. Euthyroid on exam Follow up in 6 months

## 2024-05-27 NOTE — Assessment & Plan Note (Signed)
-  Continue Vitamin D daily

## 2024-05-27 NOTE — Patient Instructions (Signed)
  Gerald [name] , Thank you for taking time to come for your Medicare Wellness Visit. I appreciate your ongoing commitment to your health goals. Please review the following plan we discussed and let me know if I can assist you in the future.   These are the goals we discussed:  Goals      Blood Pressure < 130/80     Exercise 150 min/wk Moderate Activity     Manage My Sleep Problems-Coronary Artery Disease     Timeframe:  Short-Term Goal Priority:  High Start Date:                             Expected End Date:                       Follow Up Date 11/27/2024    - get at least 7 to 8 hours of sleep at night    Why is this important?   Sometimes you may have a hard time sleeping after you have a heart attack or surgery.  Feelings like depression or anxiety can make the sleep problems worse.  Many things can help you manage it and sleep better.    Notes: pt is not sleeping more than 5 hours per night. Goal is to have  8 hours per night.     Weight (lb) < 200 lb (90.7 kg)     End goal 190-195lb        This is a list of the screening recommended for you and due dates:  Health Maintenance  Topic Date Due   Zoster (Shingles) Vaccine (2 of 2) 04/07/2024   COVID-19 Vaccine (3 - Pfizer risk series) 06/12/2024*   Flu Shot  05/29/2024   Medicare Annual Wellness Visit  05/27/2025   Colon Cancer Screening  02/12/2027   DTaP/Tdap/Td vaccine (4 - Td or Tdap) 09/16/2028   Pneumococcal Vaccine for age over 48  Completed   Hepatitis C Screening  Completed   Hepatitis B Vaccine  Aged Out   HPV Vaccine  Aged Out   Meningitis B Vaccine  Aged Out  *Topic was postponed. The date shown is not the original due date.

## 2024-05-27 NOTE — Assessment & Plan Note (Signed)
 Chronic well controlled. Continue olmesartan  40mg  daily and hydrochlorothiazide  25mg  daily. Recommend heart healthy diet such as Mediterranean diet with whole grains, fruits, vegetable, fish, lean meats, nuts, and olive oil. Limit salt. Encouraged moderate walking, 3-5 times/week for 30-50 minutes each session. Aim for at least 150 minutes.week. Goal should be pace of 3 miles/hours, or walking 1.5 miles in 30 minutes. Avoid tobacco products. Avoid excess alcohol. Take medications as prescribed and bring medications and blood pressure log with cuff to each office visit. Seek medical care for chest pain, palpitations, shortness of breath with exertion, dizziness/lightheadedness, vision changes, recurrent headaches, or swelling of extremities. Follow up in 6 months

## 2024-05-29 ENCOUNTER — Encounter: Payer: TRICARE For Life (TFL) | Admitting: Internal Medicine

## 2024-06-03 ENCOUNTER — Ambulatory Visit: Admitting: Family Medicine

## 2024-07-09 ENCOUNTER — Other Ambulatory Visit: Payer: Self-pay | Admitting: Family Medicine

## 2024-07-09 DIAGNOSIS — I10 Essential (primary) hypertension: Secondary | ICD-10-CM

## 2024-07-09 DIAGNOSIS — G250 Essential tremor: Secondary | ICD-10-CM

## 2024-07-15 ENCOUNTER — Other Ambulatory Visit

## 2024-07-15 DIAGNOSIS — I10 Essential (primary) hypertension: Secondary | ICD-10-CM

## 2024-07-15 DIAGNOSIS — E782 Mixed hyperlipidemia: Secondary | ICD-10-CM

## 2024-07-15 DIAGNOSIS — E039 Hypothyroidism, unspecified: Secondary | ICD-10-CM

## 2024-07-15 DIAGNOSIS — E559 Vitamin D deficiency, unspecified: Secondary | ICD-10-CM

## 2024-07-16 ENCOUNTER — Ambulatory Visit: Payer: Self-pay | Admitting: Family Medicine

## 2024-07-16 LAB — LIPID PANEL
Cholesterol: 166 mg/dL (ref <200)
HDL: 58 mg/dL (ref >=40)
LDL Cholesterol (Calc): 85 mg/dL
Non-HDL Cholesterol (Calc): 108 mg/dL (ref <130)
Total CHOL/HDL Ratio: 2.9 (calc) (ref <5.0)
Triglycerides: 129 mg/dL (ref <150)

## 2024-08-11 ENCOUNTER — Other Ambulatory Visit: Payer: Self-pay | Admitting: Nurse Practitioner

## 2024-08-11 ENCOUNTER — Other Ambulatory Visit: Payer: Self-pay | Admitting: Family Medicine

## 2024-08-11 NOTE — Telephone Encounter (Unsigned)
 Copied from CRM #[id number]. Topic: Clinical - Medication Refill >> Aug 11, 2024 12:06 PM Shanda MATSU wrote: Medication: rosuvastatin  (CRESTOR ) 20 MG tablet   Has the patient contacted their pharmacy? Yes (Agent: If no, request that the patient contact the pharmacy for the refill. If patient does not wish to contact the pharmacy document the reason why and proceed with request.) (Agent: If yes, when and what did the pharmacy advise?)  This is the patient's preferred pharmacy:  [facility] [place], [place] - [id number] [place] [address] Phone: [phone] Fax: [phone]  Is this the correct pharmacy for this prescription? Yes If no, delete pharmacy and type the correct one.   Has the prescription been filled recently? No  Is the patient out of the medication? Yes  Has the patient been seen for an appointment in the last year OR does the patient have an upcoming appointment? Yes  Can we respond through MyChart? Yes  Agent: Please be advised that Rx refills may take up to 3 business days. We ask that you follow-up with your pharmacy.

## 2024-08-13 MED ORDER — ROSUVASTATIN CALCIUM 20 MG PO TABS: ORAL_TABLET | ORAL | 3 refills | Status: AC

## 2024-08-13 NOTE — Telephone Encounter (Signed)
 Requested medications are due for refill today.  yes  Requested medications are on the active medications list.  yes  Last refill. 01/28/2022 #90 3 rf  Future visit scheduled.   yes  Notes to clinic.  Rx signed by Dr. Tonita.    Requested Prescriptions  Pending Prescriptions Disp Refills   rosuvastatin  (CRESTOR ) 20 MG tablet 90 tablet 3    Sig: Take  1 tablet  Daily for Cholesterol                                                   /                             TAKE                                              BYMOUTH     Cardiovascular:  Antilipid - Statins 2 Failed - 08/13/2024  1:38 PM      Failed - Lipid Panel in normal range within the last 12 months    Cholesterol  Date Value Ref Range Status  07/15/2024 166 <200 mg/dL Final   LDL (calc)  Date Value Ref Range Status  03/02/2009 115 mg/dL    LDL Cholesterol (Calc)  Date Value Ref Range Status  07/15/2024 85 mg/dL (calc) Final    Comment:    Reference range: <100 . Desirable range <100 mg/dL for primary prevention;   <70 mg/dL for patients with CHD or diabetic patients  with > or = 2 CHD risk factors. SABRA LDL-C is now calculated using the Martin-Hopkins  calculation, which is a validated novel method providing  better accuracy than the Friedewald equation in the  estimation of LDL-C.  Gladis APPLETHWAITE et al. SANDREA. 7986;689(80): 2061-2068  (http://education.[facility].com/faq/FAQ164)    HDL  Date Value Ref Range Status  07/15/2024 58 > OR = 40 mg/dL Final   Triglycerides  Date Value Ref Range Status  07/15/2024 129 <150 mg/dL Final   Triglyceride fasting, serum  Date Value Ref Range Status  03/02/2009 210 mg/dL          Passed - Cr in normal range and within 360 days    Creat  Date Value Ref Range Status  05/22/2024 1.17 0.70 - 1.35 mg/dL Final   Creatinine, Urine  Date Value Ref Range Status  05/09/2023 43 20 - 320 mg/dL Final         Passed - Patient is not pregnant      Passed - Valid encounter  within last 12 months    Recent Outpatient Visits           2 months ago Encounter for [employer] annual wellness exam   [facility] Kayla Jeoffrey RAMAN, FNP   6 months ago Encounter to establish care with new provider   [facility] Kayla Jeoffrey RAMAN, FNP

## 2024-08-14 ENCOUNTER — Other Ambulatory Visit: Payer: Self-pay | Admitting: Family Medicine

## 2024-08-14 DIAGNOSIS — I10 Essential (primary) hypertension: Secondary | ICD-10-CM

## 2024-08-17 ENCOUNTER — Other Ambulatory Visit: Payer: Self-pay | Admitting: Family Medicine

## 2024-08-17 DIAGNOSIS — I10 Essential (primary) hypertension: Secondary | ICD-10-CM

## 2024-08-17 MED ORDER — OLMESARTAN MEDOXOMIL 40 MG PO TABS: ORAL_TABLET | ORAL | 0 refills | Status: AC

## 2024-09-30 ENCOUNTER — Other Ambulatory Visit: Payer: Self-pay | Admitting: Family Medicine

## 2024-11-23 ENCOUNTER — Other Ambulatory Visit

## 2024-11-23 DIAGNOSIS — I10 Essential (primary) hypertension: Secondary | ICD-10-CM

## 2024-11-23 DIAGNOSIS — E782 Mixed hyperlipidemia: Secondary | ICD-10-CM

## 2024-11-23 DIAGNOSIS — E559 Vitamin D deficiency, unspecified: Secondary | ICD-10-CM

## 2024-11-30 ENCOUNTER — Encounter: Payer: Self-pay | Admitting: Family Medicine

## 2024-11-30 ENCOUNTER — Telehealth: Admitting: Family Medicine

## 2024-11-30 ENCOUNTER — Ambulatory Visit: Admitting: Family Medicine

## 2024-11-30 VITALS — BP 134/76 | HR 59

## 2024-11-30 DIAGNOSIS — Z8589 Personal history of malignant neoplasm of other organs and systems: Secondary | ICD-10-CM | POA: Diagnosis not present

## 2024-11-30 DIAGNOSIS — R251 Tremor, unspecified: Secondary | ICD-10-CM

## 2024-11-30 DIAGNOSIS — E039 Hypothyroidism, unspecified: Secondary | ICD-10-CM

## 2024-11-30 DIAGNOSIS — I10 Essential (primary) hypertension: Secondary | ICD-10-CM

## 2024-11-30 DIAGNOSIS — E782 Mixed hyperlipidemia: Secondary | ICD-10-CM

## 2024-11-30 DIAGNOSIS — R7303 Prediabetes: Secondary | ICD-10-CM | POA: Insufficient documentation

## 2024-11-30 DIAGNOSIS — E559 Vitamin D deficiency, unspecified: Secondary | ICD-10-CM

## 2024-11-30 DIAGNOSIS — L570 Actinic keratosis: Secondary | ICD-10-CM | POA: Diagnosis not present
